# Patient Record
Sex: Female | Born: 1980 | Race: Black or African American | Hispanic: No | Marital: Single | Smoking: Never smoker
Health system: Southern US, Community
[De-identification: ages and names within clinical notes are randomized; demographics above are authoritative.]

## PROBLEM LIST (undated history)

## (undated) DIAGNOSIS — I10 Essential (primary) hypertension: Secondary | ICD-10-CM

## (undated) HISTORY — PX: NO PAST SURGERIES: SHX2092

---

## 2002-01-12 ENCOUNTER — Emergency Department (HOSPITAL_COMMUNITY): Admission: EM | Admit: 2002-01-12 | Discharge: 2002-01-12 | Payer: Self-pay

## 2002-11-16 ENCOUNTER — Emergency Department (HOSPITAL_COMMUNITY): Admission: EM | Admit: 2002-11-16 | Discharge: 2002-11-16 | Payer: Self-pay | Admitting: Emergency Medicine

## 2003-03-01 ENCOUNTER — Other Ambulatory Visit: Admission: RE | Admit: 2003-03-01 | Discharge: 2003-03-01 | Payer: Self-pay | Admitting: Obstetrics and Gynecology

## 2003-06-26 ENCOUNTER — Inpatient Hospital Stay (HOSPITAL_COMMUNITY): Admission: AD | Admit: 2003-06-26 | Discharge: 2003-06-26 | Payer: Self-pay | Admitting: Family Medicine

## 2003-08-02 ENCOUNTER — Inpatient Hospital Stay (HOSPITAL_COMMUNITY): Admission: AD | Admit: 2003-08-02 | Discharge: 2003-08-05 | Payer: Self-pay | Admitting: Obstetrics and Gynecology

## 2003-08-14 ENCOUNTER — Inpatient Hospital Stay (HOSPITAL_COMMUNITY): Admission: EM | Admit: 2003-08-14 | Discharge: 2003-08-16 | Payer: Self-pay | Admitting: Emergency Medicine

## 2003-08-15 ENCOUNTER — Encounter (INDEPENDENT_AMBULATORY_CARE_PROVIDER_SITE_OTHER): Payer: Self-pay | Admitting: Cardiovascular Disease

## 2006-08-25 ENCOUNTER — Emergency Department (HOSPITAL_COMMUNITY): Admission: EM | Admit: 2006-08-25 | Discharge: 2006-08-25 | Payer: Self-pay | Admitting: Emergency Medicine

## 2006-09-09 ENCOUNTER — Ambulatory Visit (HOSPITAL_COMMUNITY): Admission: RE | Admit: 2006-09-09 | Discharge: 2006-09-09 | Payer: Self-pay | Admitting: Obstetrics and Gynecology

## 2007-04-06 ENCOUNTER — Inpatient Hospital Stay (HOSPITAL_COMMUNITY): Admission: AD | Admit: 2007-04-06 | Discharge: 2007-04-08 | Payer: Self-pay | Admitting: Obstetrics and Gynecology

## 2008-08-01 ENCOUNTER — Emergency Department (HOSPITAL_COMMUNITY): Admission: EM | Admit: 2008-08-01 | Discharge: 2008-08-01 | Payer: Self-pay | Admitting: Emergency Medicine

## 2008-09-20 ENCOUNTER — Emergency Department (HOSPITAL_COMMUNITY): Admission: EM | Admit: 2008-09-20 | Discharge: 2008-09-20 | Payer: Self-pay | Admitting: Emergency Medicine

## 2009-09-18 ENCOUNTER — Emergency Department (HOSPITAL_COMMUNITY): Admission: EM | Admit: 2009-09-18 | Discharge: 2009-09-18 | Payer: Self-pay | Admitting: Family Medicine

## 2009-10-07 ENCOUNTER — Emergency Department (HOSPITAL_COMMUNITY): Admission: EM | Admit: 2009-10-07 | Discharge: 2009-10-07 | Payer: Self-pay | Admitting: Emergency Medicine

## 2010-08-09 ENCOUNTER — Emergency Department (HOSPITAL_COMMUNITY): Admission: EM | Admit: 2010-08-09 | Discharge: 2010-08-09 | Payer: Self-pay | Admitting: Emergency Medicine

## 2010-09-17 ENCOUNTER — Emergency Department (HOSPITAL_COMMUNITY)
Admission: EM | Admit: 2010-09-17 | Discharge: 2010-09-17 | Payer: Self-pay | Source: Home / Self Care | Admitting: Emergency Medicine

## 2010-09-17 LAB — ABO/RH

## 2010-10-13 ENCOUNTER — Encounter: Payer: Self-pay | Admitting: Obstetrics and Gynecology

## 2010-12-02 LAB — DIFFERENTIAL
Basophils Absolute: 0 10*3/uL (ref 0.0–0.1)
Basophils Relative: 0 % (ref 0–1)
Eosinophils Absolute: 0.1 10*3/uL (ref 0.0–0.7)
Eosinophils Relative: 1 % (ref 0–5)
Lymphocytes Relative: 44 % (ref 12–46)
Lymphs Abs: 2 10*3/uL (ref 0.7–4.0)
Monocytes Absolute: 0.3 10*3/uL (ref 0.1–1.0)
Monocytes Relative: 6 % (ref 3–12)
Neutro Abs: 2.2 10*3/uL (ref 1.7–7.7)
Neutrophils Relative %: 48 % (ref 43–77)

## 2010-12-02 LAB — URINALYSIS, ROUTINE W REFLEX MICROSCOPIC
Bilirubin Urine: NEGATIVE
Glucose, UA: NEGATIVE mg/dL
Ketones, ur: NEGATIVE mg/dL
Leukocytes, UA: NEGATIVE
Nitrite: NEGATIVE
Protein, ur: NEGATIVE mg/dL
Specific Gravity, Urine: 1.024 (ref 1.005–1.030)
Urobilinogen, UA: 1 mg/dL (ref 0.0–1.0)
pH: 6.5 (ref 5.0–8.0)

## 2010-12-02 LAB — BASIC METABOLIC PANEL
BUN: 8 mg/dL (ref 6–23)
CO2: 25 mEq/L (ref 19–32)
Calcium: 8.9 mg/dL (ref 8.4–10.5)
Chloride: 105 mEq/L (ref 96–112)
Creatinine, Ser: 0.64 mg/dL (ref 0.4–1.2)
GFR calc Af Amer: >60 mL/min (ref >60)
GFR calc non Af Amer: >60 mL/min (ref >60)
Glucose, Bld: 110 mg/dL — ABNORMAL HIGH (ref 70–99)
Potassium: 3.4 mEq/L — ABNORMAL LOW (ref 3.5–5.1)
Sodium: 136 mEq/L (ref 135–145)

## 2010-12-02 LAB — CBC
HCT: 38.2 % (ref 36.0–46.0)
Hemoglobin: 12.8 g/dL (ref 12.0–15.0)
MCH: 26.9 pg (ref 26.0–34.0)
MCHC: 33.5 g/dL (ref 30.0–36.0)
MCV: 80.4 fL (ref 78.0–100.0)
Platelets: 243 10*3/uL (ref 150–400)
RBC: 4.75 MIL/uL (ref 3.87–5.11)
RDW: 12.8 % (ref 11.5–15.5)
WBC: 4.5 10*3/uL (ref 4.0–10.5)

## 2010-12-02 LAB — RAPID URINE DRUG SCREEN, HOSP PERFORMED
Amphetamines: NOT DETECTED
Barbiturates: NOT DETECTED
Benzodiazepines: NOT DETECTED
Cocaine: NOT DETECTED
Opiates: NOT DETECTED
Tetrahydrocannabinol: NOT DETECTED

## 2010-12-02 LAB — URINE MICROSCOPIC-ADD ON

## 2010-12-02 LAB — ACETAMINOPHEN LEVEL: Acetaminophen (Tylenol), Serum: <10 ug/mL — ABNORMAL LOW (ref 10–30)

## 2010-12-02 LAB — PREGNANCY, URINE: Preg Test, Ur: NEGATIVE

## 2010-12-02 LAB — SALICYLATE LEVEL: Salicylate Lvl: <4 mg/dL (ref 2.8–20.0)

## 2010-12-02 LAB — ETHANOL: Alcohol, Ethyl (B): <5 mg/dL (ref 0–10)

## 2010-12-23 LAB — POCT PREGNANCY, URINE: Preg Test, Ur: NEGATIVE

## 2010-12-23 LAB — POCT URINALYSIS DIP (DEVICE)
Bilirubin Urine: NEGATIVE
Glucose, UA: NEGATIVE mg/dL
Ketones, ur: NEGATIVE mg/dL
Nitrite: NEGATIVE
Protein, ur: 30 mg/dL — AB
Specific Gravity, Urine: 1.025 (ref 1.005–1.030)
Urobilinogen, UA: 1 mg/dL (ref 0.0–1.0)
pH: 6.5 (ref 5.0–8.0)

## 2011-01-30 ENCOUNTER — Inpatient Hospital Stay (HOSPITAL_COMMUNITY): Payer: Medicaid Other

## 2011-01-30 ENCOUNTER — Inpatient Hospital Stay (HOSPITAL_COMMUNITY)
Admission: AD | Admit: 2011-01-30 | Discharge: 2011-01-30 | Disposition: A | Discharge status: Home / Self Care | Payer: Medicaid Other | Source: Ambulatory Visit | Attending: Obstetrics and Gynecology | Admitting: Obstetrics and Gynecology

## 2011-01-30 DIAGNOSIS — O99891 Other specified diseases and conditions complicating pregnancy: Secondary | ICD-10-CM | POA: Insufficient documentation

## 2011-01-30 LAB — CBC
HCT: 35.4 % — ABNORMAL LOW (ref 36.0–46.0)
Hemoglobin: 11.9 g/dL — ABNORMAL LOW (ref 12.0–15.0)
MCH: 27 pg (ref 26.0–34.0)
MCHC: 33.6 g/dL (ref 30.0–36.0)
MCV: 80.5 fL (ref 78.0–100.0)
Platelets: 215 10*3/uL (ref 150–400)
RBC: 4.4 MIL/uL (ref 3.87–5.11)
RDW: 13.5 % (ref 11.5–15.5)
WBC: 8.5 10*3/uL (ref 4.0–10.5)

## 2011-01-30 LAB — HIV ANTIBODY (ROUTINE TESTING W REFLEX): HIV: NONREACTIVE

## 2011-01-30 LAB — ABO/RH: RH Type: POSITIVE

## 2011-01-30 LAB — HEPATITIS B SURFACE ANTIGEN: Hepatitis B Surface Ag: NEGATIVE

## 2011-01-30 LAB — ANTIBODY SCREEN: Antibody Screen: NEGATIVE

## 2011-02-04 NOTE — H&P (Signed)
Leslie Decker, Leslie Decker              ACCOUNT NO.:  [id number]   MEDICAL RECORD NO.:  [id number]          PATIENT TYPE:  INP   LOCATION:  9169                          FACILITY:  [facility]   PHYSICIAN:  Crist Fat. Rivard, M.D. DATE OF BIRTH:  [birth year]   DATE OF ADMISSION:  04/06/2007  DATE OF DISCHARGE:                              HISTORY & PHYSICAL   HISTORY:  Leslie Decker is a 30 year old single black female, gravida 5,  para 1, 0, 3, 1, at 39-5/7th weeks, who presents with leaking clear  fluid since 1:30 a.m.  She denies contractions.  Denies bleeding.  Reports positive fetal movement.  No signs or symptoms of PIH.  Her  pregnancy has been followed by the [facility] and has been remarkable for obesity, asthma,  migraines, questionable last menstrual period, EAB x3, first trimester  spotting, planned bilateral tubal ligation, Group-B Streptococcus  negative.   Her prenatal labs were collected on October 27, 2006.  Hemoglobin 12.5,  hematocrit 38.3, platelets 240,000.  Blood type B-positive.  Antibody  negative, sickle cell trait negative, RPR nonreactive, rubella immune,  hepatitis-B surface antigen negative, HIV non-reactive.  Pap smear was  within normal limits.  Gonorrhea negative, Chlamydia negative.  Cystic  fibrosis negative.  Clot screen within normal limits.  One-hour Glucola  from November 12, 2006, was 117.  One-hour Glucola again from January 08, 2007, was 105.  HIV at that time was non-reactive.  RPR at that time was  non-reactive.  Culture of the vaginal track for Group-B Streptococcus on  March 08, 2007, was negative.   HISTORY OF PRESENT PREGNANCY:  [facility] 16-5/7th weeks gestation.  She was  still having some spotting that she had in the first trimester.  In the  first trimester she had an ultrasound for viability and dating which  gave her the best [facility] of April 08, 2007.  Anatomy ultrasound at [redacted] weeks  gestation showed growth consistent with previous dating, confirming the  [facility].  Normal cardiac anatomy was seen at [redacted] weeks gestation.  The  patient signed her postpartum tubal papers on December 10, 2006.  An  ultrasonography at 31 weeks was done due to size greater than dates,  giving an estimated fetal weight in the 70's percentile with normal  fluid.  Ultrasonography again at [redacted] weeks gestation showed estimated  fetal weight to be 6 pounds and 2 ounces, which was at the 45th  percentile, with normal fluid and vertex presentation.  The rest of her  prenatal care has been unremarkable.   OB HISTORY:  She is gravida 5, para 1, 0, 3, 1.  In 2002, she had a  first trimester EAB and in November 2004, she had a vaginal delivery of  a female infant weighing 7 pounds and 6 ounces at [redacted] weeks gestation,  after 20 hours of labor.  She had an epidural from anesthesia.  The  infant's name is Jordan.  In 2005, and in 2007, in the second trimester  at approximately 13 weeks she had elective Abs.   ALLERGIES:  No known drug allergies.   MENSTRUAL HISTORY:  She experienced menarche at age 75 with 28-day  cycles lasting for five days.  She had an abnormal Pap smear in 2001.   PAST MEDICAL HISTORY:  1. She was diagnosed with asthma in 2002.  2. She had a urinary tract infection x1 in the past.  3. She reports having back problems from her epidural.  4. Migraines after her delivery.   PAST SURGICAL HISTORY:  1. Remarkable for adenoidectomy as a child.  2. Tubes in her ears.   FAMILY HISTORY:  Paternal grandmother with a myocardial infarction.  Paternal grandmother with chronic hypertension.  Maternal grandmother  with insulin-dependent diabetes.  Maternal aunt with thyroid  dysfunction.  Maternal grandfather with a CVA.  Maternal aunt and  paternal uncle with unknown types of cancer.   GENETIC HISTORY:  Remarkable for the father of the baby with cleft  lip  and with sickle cell trait.  The father of the baby's daughter with  sickle cell trait.  The father of the baby's siblings with sickle cell  disease.   SOCIAL HISTORY:  The patient is single.  Father of the baby is involved  and supportive.  His name is Jamar.  The patient has 16 years of  education and is unemployed.  The father of the baby has 12 years of  education and is employed full time as a Merchandiser, retail.  They deny any  alcohol, tobacco or illicit drug use with the pregnancy.   PHYSICAL EXAMINATION:  VITAL SIGNS:  Stable.  She is afebrile.  HEENT:  Grossly within normal limits.  CHEST:  Clear to auscultation.  HEART:  Regular rate and rhythm.  ABDOMEN:  Gravid and contour with fundal height extending approximately  40 cm above the pubic symphysis.  Fetal heart rate is reactive and  reassuring.  There are no contractions.  PELVIC EXAM:  Shows positive fluid on the perineum.  Positive Nitrazine,  positive ferning.  Cervix is 3 cm, 70% effaced, with vertex -2, with  clear fluid present.  EXTREMITIES:  Normal.   ASSESSMENT:  1. Intrauterine pregnancy at term.  2. Premature rupture of membranes.  3. Group-B Streptococcus negative.   PLAN:  1. To admit to the birthing suite.  2. Routine C.N.M. orders.  3. Planned expectant management for now.      Cam Hai, C.N.M.      Crist Fat Rivard, M.D.  Electronically Signed    KS/MEDQ  D:  04/06/2007  T:  04/06/2007  Job:  [id number]

## 2011-02-07 NOTE — H&P (Signed)
NAMEZURIA, FOSDICK NO.:  [id number]   MEDICAL RECORD NO.:  [id number]                   PATIENT TYPE:  INP   LOCATION:  9164                                 FACILITY:  [facility]   PHYSICIAN:  Osborn Coho, M.D.                DATE OF BIRTH:  09/26/1980   DATE OF ADMISSION:  08/02/2003  DATE OF DISCHARGE:                                HISTORY & PHYSICAL   This is a 30 year old gravida 2, para 0-0-1-0, at 41-1/7 weeks, who presents  with complaints of gush of fluid around midnight with mild uterine  contractions following.  Reports positive fetal movement and no bleeding.  Pregnancy has been followed by the nurse midwife service and remarkable for:   1. Unsure dates.  2. Late to care.  3. Asthma.  4. Group B strep positive.    PAST OBSTETRICAL HISTORY:  Remarkable for first trimester elective abortion  in 2000.   PAST MEDICAL HISTORY:  Remarkable for history of abnormal Pap once with  normal ones after that.  History of childhood varicella.  History of asthma,  for which she uses an inhaler on as-needed basis.   PAST SURGICAL HISTORY:  Remarkable for adenoidectomy and tubes in her ears  as a child.   FAMILY HISTORY:  Remarkable for grandmother with heart disease, a  grandmother, grandfather, and aunt with hypertension, grandmother with  diabetes, an aunt with thyroid disease, father with drug and alcohol use.   GENETIC HISTORY:  Unremarkable.   SOCIAL HISTORY:  Patient is single but involved with Jamar Sparkman, who is  involved and supportive.  She is of the [employer].  She denies any  alcohol, tobacco, or drug use.   PRENATAL LABORATORY DATA:  Hemoglobin 11.3, platelets 248.  Blood type B  positive, antibody screen negative.  Sickle cell negative.  RPR nonreactive.  Rubella immune.  Hepatitis negative.  HIV nonreactive.  Pap test normal.  Gonorrhea negative.  Chlamydia negative.  Cystic fibrosis declined.  Quad  screen not done  secondary to late to care.  Group B strep positive.   OBJECTIVE:  VITAL SIGNS:  Stable, afebrile.  HEENT:  Within normal limits.  NECK:  Thyroid normal, not enlarged.  CHEST:  Clear to auscultation.  CARDIAC:  Regular rate and rhythm.  ABDOMEN:  Gravid at 40 cm, vertex to Leopold's.  EFM shows a nonreactive  fetal heart rate tracing with no decelerations.  Uterine contractions every  two to three minutes, which are mild.  PELVIC:  Cervical exam reveals gross ruptured membranes with clear fluid,  positive Nitrazine, and a cervix dilation of 2 cm, 80% effaced, -1 station,  with a vertex presentation.  EXTREMITIES:  Within normal limits.   ASSESSMENT:  1. Intrauterine pregnancy at 41-1/7 weeks.  2. Premature rupture of membrane at term.  3. Latent phase labor.  4. Group B Streptococcus positive.   PLAN:  1.  Admit to birthing suites, Dr. Su Hilt notified.  2. Routine C.N.M. orders.  3. Penicillin prophylaxis.  4. Expectant management versus Pitocin were discussed and since the patient     is contracting, will await increased labor for now and use Pitocin     augmentation later as needed.     Marie L. Williams, C.N.M.                 Osborn Coho, M.D.    MLW/MEDQ  D:  08/02/2003  T:  08/02/2003  Job:  [id number]

## 2011-02-07 NOTE — H&P (Signed)
NAMEGEORGA, STYS NO.:  [id number]   MEDICAL RECORD NO.:  [id number]                   PATIENT TYPE:  INP   LOCATION:  3029                                 FACILITY:  [facility]   PHYSICIAN:  Marlan Palau, M.D.               DATE OF BIRTH:  11/26/1980   DATE OF ADMISSION:  08/14/2003  DATE OF DISCHARGE:                                HISTORY & PHYSICAL   HISTORY OF PRESENT ILLNESS:  Leslie Decker is a 30 year old right-handed  black female born [birth year], with a history of a recent delivery  approximately a week ago. The patient is not married living with her parents  in the [place] area. The patient apparently came home from school today  upset as she had gotten into an argument with her professor concerning a  paper that was due.  The patient developed a headache around 6:30 p.m. this  evening in the bifrontal region. The patient claims the headaches are not  frequent for her having one or two a year. The patient, however, by 9:30  p.m. developed increased headache and onset of left-sided weakness and  numbness. The patient lost vision off the left and had a sharp pain that  shot down the left side of her body, face, arms, and legs, and with this  loss the use of the left side of the body.  The patient was unable to speak  well, was reportedly flaccid on the left side when she first came to the  emergency department. The patient underwent a CT scan of the brain that was  unremarkable and by the time neurology arrived for an evaluation, the  patient had regained all strength in the left side, but still complained of  some decreased sensation in the left arm and leg excluding the face. The  patient had normalized vision and speech. No evidence of hemorrhage or  infarct noted on CT. The patient is admitted at this point for further  evaluation.   PAST MEDICAL HISTORY:  1. New onset left-sided deficits as above that have significantly  resolved.  2. Headache as above.  3. Obesity.  4. Postpartum times one week; no complications during pregnancy were noted     such as elevation of blood sugar, blood pressure, or proteinuria.  5. History of asthma.   MEDICATIONS:  At this time include albuterol inhaler p.r.n.   ALLERGIES:  No known drug allergies.   SOCIAL HISTORY:  She does not smoke or drink. The patient denies the use of  drug such as cocaine or marijuana. The patient is single and lives in the  [place] area. She is a Consulting civil engineer, not employed.   FAMILY MEDICAL HISTORY:  Notable for hypertension. On the father's side  there is an abdominal aortic aneurysm. Diabetes runs in the mother's side.   REVIEW OF SYSTEMS:  No recent fevers or chills. The patient did report  headache today. Denies nausea, vomiting, shortness of breath, chest pain,  palpitations, trouble controlling bowels or bladder. The patient delivered  by spontaneous vaginal delivery a week ago without complications. The  patient denies blackouts or seizures.   PHYSICAL EXAMINATION:  VITAL SIGNS: Blood pressure 123/67, heart rate 80,  respiratory rate 26, temperature 99.3.  GENERAL: This patient is moderately obese black female who is alert and  cooperative at the time of examination.  HEENT:  Head is atraumatic. Eyes reveal pupils equal, round, and reactive to  light. Disks are flat bilaterally.  NECK: Supple. No carotid bruits.  RESPIRATORY:  Clear.  CARDIOVASCULAR: Regular rate and rhythm with no obvious murmurs or rubs  noted.  ABDOMEN: Soft and nontender; positive bowel sounds.  EXTREMITIES: Without significant edema.  NEUROLOGIC: Cranial nerves as above. Facial asymmetry is present. The  patient has good smile and grimace. Has good symmetry to pinprick sensation  in the lower face, maybe a decreased pinprick sensation slightly in the left  forehead as compared to the right. The patient has normal speech pattern,  full visual fields, full  extraocular movements seen. Motor strength is full  and normal in all four extremities. No drift is seen on the arm or leg. The  patient has decreased pinprick sensation and vibratory sensation in the left  arm and left leg as compared to the right. Deep tendon reflexes are brisk,  but symmetric throughout. Toes are neutral bilaterally.  The patient has  fair finger-nose-finger.  No toe-to-finger bilaterally.  The patient is not  ambulated.   Laboratory values including comprehensive metabolic profiles, CBC, and coags  are pending at this time.  Chest x-ray is pending.  CT of the head is as  above.   IMPRESSION:  1. Transient episode of left-sided weakness, headache.  Rule out transient     ischemic attack/neurologic migraine/psychogenic event.  2. One-week postpartum.  3. History of asthma.   This patient has no objective neurologic deficit at this point. She does  complain of some numbness on the left side. ER physician staff states that  patient was flaccid on the left side when she first came to the emergency  department, but clearly is not the case at this point and has normal  strength. Will admit this patient for further evaluation at this point.   PLAN:  1. Admission to [facility].  2. IV fluids.  3. IV heparin.  4. MRI of the brain.  5. MRA.  6. 2-D echocardiogram.  7. Consider transesophageal echocardiogram.  8. Hypocoagulable state workup.  9. Will get a urine drug screen as well.   I will follow the patient's clinical course while inhouse.                                                Marlan Palau, M.D.    CKW/MEDQ  D:  08/14/2003  T:  08/15/2003  Job:  [id number]   cc:   [facility]  [address] [place]

## 2011-02-07 NOTE — Discharge Summary (Signed)
Leslie Decker, POMERLEAU                          ACCOUNT NO.:  [id number]   MEDICAL RECORD NO.:  [id number]                   PATIENT TYPE:  INP   LOCATION:  3029                                 FACILITY:  [facility]   PHYSICIAN:  Pramod P. Pearlean Brownie, MD                 DATE OF BIRTH:  04-19-1981   DATE OF ADMISSION:  08/14/2003  DATE OF DISCHARGE:  08/16/2003                                 DISCHARGE SUMMARY   DISCHARGE DIAGNOSES:  1. 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  2. Two weeks postpartum.  3. Obesity.  4. History of asthma.   DISCHARGE MEDICATIONS:  1. Birth control pills.  2. Pulmicort.   STUDIES PERFORMED:  1. CT of the head on admission shows mucous retention cyst in right     maxillary sinus.  No definite signs of stroke.  Some faint hypodensity in     the left cerebellum and left pons which is highly likely due to streak     infarct.  2. MRI of the brain negative for acute infarct.  3. MRA of the brain negative.  4. Chest x-ray:  No active disease.  5. EKG:  Normal sinus rhythm.   LABORATORIES:  Hemoglobin 11.5, hematocrit 34.4, white blood cells 4.9, red  blood cells 4.16, MCV 82.7, MCHC 33.4, RDW 13.2, platelets 316,000.  Differential normal.  Sedimentation rate 17.  Coagulation studies normal.  Antithrombin 379.  Antiphospholipid antibody with lupus anticoagulant  detected.  Total protein C & S pending.  Protein S functional 69, low.  Protein C functional 94.  Sodium 140, potassium 3.5, chloride 108, CO2 25,  glucose 90, BUN 8, creatinine 0.8, calcium 8.5.  Total protein 6.1, albumin  3.3, AST 16, ALT 21, ALP 76, total bilirubin 0.6.  NGO converting enzymes  pending.  Cholesterol 128, triglycerides 105, HDL 40, LDL 67.  Serum drug  screen pending.  RPR negative.  ANA pending.  Antiphospholipid antibody  pending.   HISTORY OF PRESENT ILLNESS:  Ms. Leslie Decker is a 30 year old right-  handed black female postpartum two weeks who came home from school today  upset that she got in an argument with her professor concerning a paper that  was due.  She developed a headache around 6:30 p.m. in the bifrontal region.  She claims the headaches are not frequent for her, having only one to two  this year.  By 9:30 p.m. the headache increased in intensity and she  developed left-sided weakness and numbness.  She lost vision off to the left  and had a sharp pain that shot down the left side of her body, face, arms,  and legs and loss the use of the left side of her body.  She was unable to  speak well and was reportedly flaccid on the left when she first came to the  emergency department.  She underwent a CT scan of the brain that  was  unremarkable and by the time neurology arrived for evaluation the patient  had regained all strength on the left side, but still complained of some  decreased sensation in the left arm and leg including the face.  Her vision  and speech normalized.  She had no evidence of hemorrhage or infarct on the  CT.  She was admitted to the hospital for further work-up.   MRI was negative for acute infarct.  It was determined that she had history  of panic attack some years ago and was on Paxil for that.  The night before  discharge she did have an episode of slurred speech with inability to move  the tongue to the right and could not feel her left arm.  This occurred  about 10:06 p.m.  By 10:50 p.m. symptoms had resolved except for the  headache.  It was again felt that this was most likely related to anxiety as  MRI was negative (of note, she is in school as a speech therapist).   Of note, secondary to stroke risk, patient was evaluated for hypercoagulable  work-up.  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  This needs to be followed up by  her primary physician as other work-up remains pending.  The patient has  been encouraged to see a primary care physician within the next month.  She  plans to go home with her parents for a few weeks  after discharge from the  hospital.   CONDITION ON DISCHARGE:  The patient alert and oriented x3.  She is sleepy,  but appropriate.  Her speech is clear.  She has no aphasia.  She has no  weakness and her sensation is intact.   DISCHARGE PLAN:  Discharge home with family.   FOLLOWUP:  Primary M.D. for anxiety treatment and lupus anticoagulant follow-  up.      Annie Main, N.P.                         Pramod P. Pearlean Brownie, MD    SB/MEDQ  D:  08/16/2003  T:  08/16/2003  Job:  [id number]   cc:   [facility]

## 2011-02-14 ENCOUNTER — Inpatient Hospital Stay (HOSPITAL_COMMUNITY)
Admission: AD | Admit: 2011-02-14 | Discharge: 2011-02-14 | Disposition: A | Discharge status: Home / Self Care | Payer: Medicaid Other | Source: Ambulatory Visit | Attending: Obstetrics and Gynecology | Admitting: Obstetrics and Gynecology

## 2011-02-14 DIAGNOSIS — O210 Mild hyperemesis gravidarum: Secondary | ICD-10-CM | POA: Insufficient documentation

## 2011-02-14 LAB — URINALYSIS, ROUTINE W REFLEX MICROSCOPIC
Glucose, UA: NEGATIVE mg/dL
Hgb urine dipstick: NEGATIVE
Ketones, ur: NEGATIVE mg/dL
Protein, ur: NEGATIVE mg/dL
Urobilinogen, UA: 0.2 mg/dL (ref 0.0–1.0)

## 2011-02-14 LAB — DIFFERENTIAL
Basophils Absolute: 0 10*3/uL (ref 0.0–0.1)
Eosinophils Relative: 1 % (ref 0–5)
Lymphocytes Relative: 14 % (ref 12–46)
Lymphs Abs: 0.9 10*3/uL (ref 0.7–4.0)
Neutro Abs: 5.3 10*3/uL (ref 1.7–7.7)
Neutrophils Relative %: 79 % — ABNORMAL HIGH (ref 43–77)

## 2011-02-14 LAB — COMPREHENSIVE METABOLIC PANEL
ALT: 17 U/L (ref 0–35)
Alkaline Phosphatase: 42 U/L (ref 39–117)
BUN: 4 mg/dL — ABNORMAL LOW (ref 6–23)
CO2: 19 mEq/L (ref 19–32)
Chloride: 103 mEq/L (ref 96–112)
Glucose, Bld: 109 mg/dL — ABNORMAL HIGH (ref 70–99)
Potassium: 3.8 mEq/L (ref 3.5–5.1)
Sodium: 133 mEq/L — ABNORMAL LOW (ref 135–145)
Total Bilirubin: 0.3 mg/dL (ref 0.3–1.2)
Total Protein: 6.6 g/dL (ref 6.0–8.3)

## 2011-02-14 LAB — CBC
HCT: 36.3 % (ref 36.0–46.0)
Hemoglobin: 12 g/dL (ref 12.0–15.0)
MCV: 81.4 fL (ref 78.0–100.0)
RBC: 4.46 MIL/uL (ref 3.87–5.11)
WBC: 6.7 10*3/uL (ref 4.0–10.5)

## 2011-06-23 ENCOUNTER — Inpatient Hospital Stay (HOSPITAL_COMMUNITY)
Admission: AD | Admit: 2011-06-23 | Discharge: 2011-06-27 | DRG: 767 | Disposition: A | Discharge status: Home / Self Care | Payer: Medicaid Other | Source: Ambulatory Visit | Attending: Obstetrics and Gynecology | Admitting: Obstetrics and Gynecology

## 2011-06-23 ENCOUNTER — Encounter (HOSPITAL_COMMUNITY): Payer: Self-pay | Admitting: *Deleted

## 2011-06-23 DIAGNOSIS — O1493 Unspecified pre-eclampsia, third trimester: Secondary | ICD-10-CM | POA: Diagnosis present

## 2011-06-23 DIAGNOSIS — IMO0002 Reserved for concepts with insufficient information to code with codable children: Principal | ICD-10-CM | POA: Diagnosis present

## 2011-06-23 DIAGNOSIS — O99210 Obesity complicating pregnancy, unspecified trimester: Secondary | ICD-10-CM | POA: Diagnosis present

## 2011-06-23 DIAGNOSIS — Z302 Encounter for sterilization: Secondary | ICD-10-CM

## 2011-06-23 DIAGNOSIS — O99214 Obesity complicating childbirth: Secondary | ICD-10-CM | POA: Diagnosis present

## 2011-06-23 DIAGNOSIS — E669 Obesity, unspecified: Secondary | ICD-10-CM | POA: Diagnosis present

## 2011-06-23 DIAGNOSIS — O1490 Unspecified pre-eclampsia, unspecified trimester: Secondary | ICD-10-CM

## 2011-06-23 DIAGNOSIS — K219 Gastro-esophageal reflux disease without esophagitis: Secondary | ICD-10-CM | POA: Diagnosis present

## 2011-06-23 DIAGNOSIS — J45909 Unspecified asthma, uncomplicated: Secondary | ICD-10-CM | POA: Diagnosis present

## 2011-06-23 DIAGNOSIS — I10 Essential (primary) hypertension: Secondary | ICD-10-CM | POA: Diagnosis present

## 2011-06-23 LAB — COMPREHENSIVE METABOLIC PANEL
Albumin: 2.9 g/dL — ABNORMAL LOW (ref 3.5–5.2)
Alkaline Phosphatase: 112 U/L (ref 39–117)
BUN: 11 mg/dL (ref 6–23)
CO2: 22 mEq/L (ref 19–32)
Chloride: 104 mEq/L (ref 96–112)
GFR calc non Af Amer: >90 mL/min (ref >90)
Potassium: 4.4 mEq/L (ref 3.5–5.1)
Total Bilirubin: 0.2 mg/dL — ABNORMAL LOW (ref 0.3–1.2)

## 2011-06-23 LAB — URINALYSIS, DIPSTICK ONLY
Bilirubin Urine: NEGATIVE
Nitrite: NEGATIVE
Specific Gravity, Urine: >1.03 — ABNORMAL HIGH (ref 1.005–1.030)
pH: 6 (ref 5.0–8.0)

## 2011-06-23 LAB — CBC
HCT: 34.8 % — ABNORMAL LOW (ref 36.0–46.0)
Hemoglobin: 11.7 g/dL — ABNORMAL LOW (ref 12.0–15.0)
MCV: 80.4 fL (ref 78.0–100.0)
Platelets: 236 10*3/uL (ref 150–400)
RBC: 4.33 MIL/uL (ref 3.87–5.11)
WBC: 8 10*3/uL (ref 4.0–10.5)

## 2011-06-23 LAB — URIC ACID: Uric Acid, Serum: 4.7 mg/dL (ref 2.4–7.0)

## 2011-06-23 MED ORDER — OXYTOCIN BOLUS FROM INFUSION
500.0000 mL | Freq: Once | INTRAVENOUS | Status: AC
  Administered 2011-06-25: 500 mL via INTRAVENOUS
  Filled 2011-06-23: qty 1000
  Filled 2011-06-23: qty 500

## 2011-06-23 MED ORDER — IBUPROFEN 600 MG PO TABS: 600.0000 mg | ORAL_TABLET | Freq: Four times a day (QID) | ORAL | Status: DC | PRN

## 2011-06-23 MED ORDER — ONDANSETRON HCL 4 MG/2ML IJ SOLN: 4.0000 mg | Freq: Four times a day (QID) | INTRAMUSCULAR | Status: DC | PRN

## 2011-06-23 MED ORDER — OXYCODONE-ACETAMINOPHEN 5-325 MG PO TABS: 2.0000 | ORAL_TABLET | ORAL | Status: DC | PRN

## 2011-06-23 MED ORDER — LACTATED RINGERS IV SOLN
500.0000 mL | INTRAVENOUS | Status: DC | PRN
  Administered 2011-06-25: 500 mL via INTRAVENOUS

## 2011-06-23 MED ORDER — LIDOCAINE HCL (PF) 1 % IJ SOLN
30.0000 mL | INTRAMUSCULAR | Status: DC | PRN
  Filled 2011-06-23: qty 30

## 2011-06-23 MED ORDER — LABETALOL HCL 5 MG/ML IV SOLN
10.0000 mg | Freq: Once | INTRAVENOUS | Status: AC
  Administered 2011-06-23: 10 mg via INTRAVENOUS
  Filled 2011-06-23: qty 4

## 2011-06-23 MED ORDER — MAGNESIUM SULFATE BOLUS VIA INFUSION
4.0000 g | Freq: Once | INTRAVENOUS | Status: DC
  Filled 2011-06-23: qty 500

## 2011-06-23 MED ORDER — MAGNESIUM SULFATE 40 G IN LACTATED RINGERS - SIMPLE
2.0000 g/h | INTRAVENOUS | Status: DC
  Administered 2011-06-24: 4 g/h via INTRAVENOUS
  Administered 2011-06-24 – 2011-06-26 (×3): 2 g/h via INTRAVENOUS
  Filled 2011-06-23 (×4): qty 500

## 2011-06-23 MED ORDER — ACETAMINOPHEN 325 MG PO TABS: 650.0000 mg | ORAL_TABLET | ORAL | Status: DC | PRN

## 2011-06-23 MED ORDER — OXYTOCIN 20 UNITS IN LACTATED RINGERS INFUSION - SIMPLE
125.0000 mL/h | Freq: Once | INTRAVENOUS | Status: AC
  Administered 2011-06-25: 125 mL/h via INTRAVENOUS

## 2011-06-23 MED ORDER — CITRIC ACID-SODIUM CITRATE 334-500 MG/5ML PO SOLN: 30.0000 mL | ORAL | Status: DC | PRN

## 2011-06-23 MED ORDER — LACTATED RINGERS IV SOLN
INTRAVENOUS | Status: DC
  Administered 2011-06-24 – 2011-06-25 (×3): via INTRAVENOUS

## 2011-06-23 MED ORDER — FLEET ENEMA 7-19 GM/118ML RE ENEM: 1.0000 | ENEMA | RECTAL | Status: DC | PRN

## 2011-06-24 ENCOUNTER — Encounter (HOSPITAL_COMMUNITY): Payer: Self-pay | Admitting: *Deleted

## 2011-06-24 DIAGNOSIS — O1493 Unspecified pre-eclampsia, third trimester: Secondary | ICD-10-CM | POA: Diagnosis present

## 2011-06-24 DIAGNOSIS — O99210 Obesity complicating pregnancy, unspecified trimester: Secondary | ICD-10-CM | POA: Diagnosis present

## 2011-06-24 DIAGNOSIS — K219 Gastro-esophageal reflux disease without esophagitis: Secondary | ICD-10-CM | POA: Diagnosis present

## 2011-06-24 DIAGNOSIS — J45909 Unspecified asthma, uncomplicated: Secondary | ICD-10-CM | POA: Diagnosis present

## 2011-06-24 LAB — CBC
HCT: 34.7 % — ABNORMAL LOW (ref 36.0–46.0)
Hemoglobin: 11.6 g/dL — ABNORMAL LOW (ref 12.0–15.0)
MCH: 26.9 pg (ref 26.0–34.0)
MCHC: 33.4 g/dL (ref 30.0–36.0)
MCV: 80.3 fL (ref 78.0–100.0)
RDW: 13.9 % (ref 11.5–15.5)

## 2011-06-24 LAB — MAGNESIUM: Magnesium: 4.8 mg/dL — ABNORMAL HIGH (ref 1.5–2.5)

## 2011-06-24 LAB — URIC ACID: Uric Acid, Serum: 4.9 mg/dL (ref 2.4–7.0)

## 2011-06-24 LAB — COMPREHENSIVE METABOLIC PANEL
Albumin: 2.7 g/dL — ABNORMAL LOW (ref 3.5–5.2)
Alkaline Phosphatase: 106 U/L (ref 39–117)
BUN: 6 mg/dL (ref 6–23)
Creatinine, Ser: 0.6 mg/dL (ref 0.50–1.10)
GFR calc Af Amer: >90 mL/min (ref >90)
Glucose, Bld: 81 mg/dL (ref 70–99)
Potassium: 3.9 mEq/L (ref 3.5–5.1)
Total Protein: 6.3 g/dL (ref 6.0–8.3)

## 2011-06-24 LAB — LACTATE DEHYDROGENASE: LDH: 226 U/L (ref 94–250)

## 2011-06-24 LAB — POCT PREGNANCY, URINE: Preg Test, Ur: NEGATIVE

## 2011-06-24 MED ORDER — MENTHOL 3 MG MT LOZG
1.0000 | LOZENGE | OROMUCOSAL | Status: DC | PRN
  Filled 2011-06-24: qty 9

## 2011-06-24 MED ORDER — GUAIFENESIN-DM 100-10 MG/5ML PO SYRP
5.0000 mL | ORAL_SOLUTION | ORAL | Status: DC | PRN
  Filled 2011-06-24: qty 5

## 2011-06-24 MED ORDER — TERBUTALINE SULFATE 1 MG/ML IJ SOLN: 0.2500 mg | Freq: Once | INTRAMUSCULAR | Status: AC | PRN

## 2011-06-24 MED ORDER — DINOPROSTONE 10 MG VA INST
10.0000 mg | VAGINAL_INSERT | Freq: Once | VAGINAL | Status: AC
  Administered 2011-06-24: 10 mg via VAGINAL
  Filled 2011-06-24: qty 1

## 2011-06-24 MED ORDER — BUTORPHANOL TARTRATE 2 MG/ML IJ SOLN
2.0000 mg | INTRAMUSCULAR | Status: DC | PRN
  Administered 2011-06-25: 2 mg via INTRAVENOUS
  Filled 2011-06-24: qty 1

## 2011-06-24 MED ORDER — PRENATAL PLUS 27-1 MG PO TABS
1.0000 | ORAL_TABLET | Freq: Every day | ORAL | Status: DC
  Administered 2011-06-24: 1 via ORAL
  Filled 2011-06-24: qty 1

## 2011-06-24 MED ORDER — OXYTOCIN 20 UNITS IN LACTATED RINGERS INFUSION - SIMPLE
1.0000 m[IU]/min | INTRAVENOUS | Status: DC
  Administered 2011-06-24: 2 m[IU]/min via INTRAVENOUS
  Filled 2011-06-24: qty 1000

## 2011-06-24 MED ORDER — PANTOPRAZOLE SODIUM 20 MG PO TBEC
20.0000 mg | DELAYED_RELEASE_TABLET | Freq: Every day | ORAL | Status: DC
  Administered 2011-06-24: 20 mg via ORAL
  Filled 2011-06-24 (×2): qty 1

## 2011-06-24 NOTE — Progress Notes (Signed)
Leslie Decker is a 30 y.o. G3P2002 at [redacted]w[redacted]d admitted for induction of labor due to mild preeclampsia.  Subjective: Denies HA, visual changes or abdominal pain.  Just now started feeling contractions.  No complaints.  Objective: BP 146/77  Pulse 91  Temp(Src) 98.1 F (36.7 C) (Oral)  Resp 22  Ht 5\' 6"  (1.676 m)  Wt 133.358 kg (294 lb)  BMI 47.45 kg/m2 I/O last 3 completed shifts: In: 1127.9 [P.O.:350; I.V.:777.9] Out: 1525 [Urine:1525] Total I/O In: 1875.6 [P.O.:500; I.V.:1375.6] Out: 1580 [Urine:1580]  FHT:  FHR: 130's bpm, variability: moderate,  accelerations:  Present,  decelerations:  Absent UC:    irregular SVE:   Dilation: 2 Effacement (%): 50 Station: Ballotable Exam by:: [employer]: Lab Results  Component Value Date   WBC 8.0 06/23/2011   HGB 11.7* 06/23/2011   HCT 34.8* 06/23/2011   MCV 80.4 06/23/2011   PLT 236 06/23/2011    Assessment / Plan: 30yo G3P2 at 40 wks undergoing induction secondary to Gest HTN and presumption of mild preeclampsia on MGSO4.  Currently stable.  Cont to titrate pitocin per protocol.  Fetal status is overall reassuring.  Will check labs and MG level now.    Purcell Nails 06/24/2011, 6:56 PM

## 2011-06-24 NOTE — H&P (Signed)
Leslie Decker is a 30 y.o. female presenting for induction because of elevated blood pressure. Maternal Medical History:  Fetal activity: Perceived fetal activity is normal.    Prenatal complications: Hypertension and pre-eclampsia.   No bleeding, cholelithiasis, HIV, infection, IUGR, nephrolithiasis, oligohydramnios, placental abnormality, polyhydramnios, preterm labor, substance abuse, thrombocytopenia or thrombophilia.   Prenatal Complications - Diabetes: none.    OB History    Grav Para Term Preterm Abortions TAB SAB Ect Mult Living   3 2 2       2      Past Medical History  Diagnosis Date  . Asthma    Past Surgical History  Procedure Date  . No past surgeries    Family History: family history includes Diabetes in her maternal grandmother and Hypertension in her maternal grandmother. Social History:  reports that she has never smoked. She does not have any smokeless tobacco history on file. She reports that she does not drink alcohol or use illicit drugs.  Review of Systems  Eyes: Negative for blurred vision, double vision and photophobia.  Respiratory: Negative for wheezing.   Gastrointestinal: Negative for abdominal pain.  Neurological: Negative for headaches.  All other systems reviewed and are negative.    Dilation: 2.5 Effacement (%): 50 Station: Ballotable Exam by:: Dr. Pennie Rushing  Blood pressure 169/98, pulse 91, temperature 98.2 F (36.8 C), temperature source Oral, resp. rate 18, height 5\' 6"  (1.676 m), weight 294 lb (133.358 kg). Exam Physical Exam  Prenatal labs: ABO, Rh: AB/Positive/-- (05/10 0000) Antibody: Negative (05/10 0000) Rubella: Equivocal (05/10 0000) RPR: NON REACTIVE (10/01 2030)  HBsAg: Negative (05/10 0000)  HIV: Non-reactive (05/10 0000)  GBS:     Assessment/Plan: IUP at term Pre eclampsia Hx asthma Morbid obesity Equivocal Rubella  Plan: IV magnesium started Cervidil Start Pitocin in am.   Leslie Decker P 06/24/2011, 1:41  AM

## 2011-06-25 ENCOUNTER — Encounter (HOSPITAL_COMMUNITY)
Admission: AD | Disposition: A | Discharge status: Home / Self Care | Payer: Self-pay | Source: Ambulatory Visit | Attending: Obstetrics and Gynecology

## 2011-06-25 ENCOUNTER — Other Ambulatory Visit: Payer: Self-pay | Admitting: Obstetrics and Gynecology

## 2011-06-25 ENCOUNTER — Encounter (HOSPITAL_COMMUNITY): Payer: Self-pay | Admitting: Anesthesiology

## 2011-06-25 ENCOUNTER — Encounter (HOSPITAL_COMMUNITY): Payer: Self-pay

## 2011-06-25 ENCOUNTER — Inpatient Hospital Stay (HOSPITAL_COMMUNITY): Payer: Medicaid Other | Admitting: Anesthesiology

## 2011-06-25 HISTORY — PX: TUBAL LIGATION: SHX77

## 2011-06-25 LAB — COMPREHENSIVE METABOLIC PANEL
ALT: 19 U/L (ref 0–35)
AST: 18 U/L (ref 0–37)
Calcium: 8.1 mg/dL — ABNORMAL LOW (ref 8.4–10.5)
GFR calc Af Amer: >90 mL/min (ref >90)
Sodium: 136 mEq/L (ref 135–145)
Total Protein: 6.3 g/dL (ref 6.0–8.3)

## 2011-06-25 LAB — MRSA PCR SCREENING: MRSA by PCR: NEGATIVE

## 2011-06-25 LAB — CBC
MCH: 26.8 pg (ref 26.0–34.0)
MCHC: 33.1 g/dL (ref 30.0–36.0)
Platelets: 226 10*3/uL (ref 150–400)

## 2011-06-25 LAB — MAGNESIUM: Magnesium: 5.4 mg/dL — ABNORMAL HIGH (ref 1.5–2.5)

## 2011-06-25 SURGERY — LIGATION, FALLOPIAN TUBE, POSTPARTUM
Anesthesia: Epidural | Site: Abdomen | Laterality: Bilateral | Wound class: Clean Contaminated

## 2011-06-25 MED ORDER — SIMETHICONE 80 MG PO CHEW: 80.0000 mg | CHEWABLE_TABLET | ORAL | Status: DC | PRN

## 2011-06-25 MED ORDER — PRENATAL PLUS 27-1 MG PO TABS: 1.0000 | ORAL_TABLET | Freq: Every day | ORAL | Status: DC

## 2011-06-25 MED ORDER — ONDANSETRON HCL 4 MG/2ML IJ SOLN: 4.0000 mg | INTRAMUSCULAR | Status: DC | PRN

## 2011-06-25 MED ORDER — ONDANSETRON HCL 4 MG PO TABS: 4.0000 mg | ORAL_TABLET | ORAL | Status: DC | PRN

## 2011-06-25 MED ORDER — PHENYLEPHRINE 40 MCG/ML (10ML) SYRINGE FOR IV PUSH (FOR BLOOD PRESSURE SUPPORT)
80.0000 ug | PREFILLED_SYRINGE | INTRAVENOUS | Status: DC | PRN
  Administered 2011-06-25: 80 ug via INTRAVENOUS
  Filled 2011-06-25: qty 5

## 2011-06-25 MED ORDER — BENZOCAINE-MENTHOL 20-0.5 % EX AERO: 1.0000 "application " | INHALATION_SPRAY | CUTANEOUS | Status: DC | PRN

## 2011-06-25 MED ORDER — PHENYLEPHRINE 40 MCG/ML (10ML) SYRINGE FOR IV PUSH (FOR BLOOD PRESSURE SUPPORT): 80.0000 ug | PREFILLED_SYRINGE | INTRAVENOUS | Status: DC | PRN

## 2011-06-25 MED ORDER — DIPHENHYDRAMINE HCL 50 MG/ML IJ SOLN: 12.5000 mg | INTRAMUSCULAR | Status: DC | PRN

## 2011-06-25 MED ORDER — SENNOSIDES-DOCUSATE SODIUM 8.6-50 MG PO TABS
2.0000 | ORAL_TABLET | Freq: Every day | ORAL | Status: DC
  Administered 2011-06-25 – 2011-06-26 (×2): 2 via ORAL

## 2011-06-25 MED ORDER — ONDANSETRON HCL 4 MG/2ML IJ SOLN
INTRAMUSCULAR | Status: AC
  Filled 2011-06-25: qty 2

## 2011-06-25 MED ORDER — WITCH HAZEL-GLYCERIN EX PADS: 1.0000 "application " | MEDICATED_PAD | CUTANEOUS | Status: DC | PRN

## 2011-06-25 MED ORDER — TETANUS-DIPHTH-ACELL PERTUSSIS 5-2.5-18.5 LF-MCG/0.5 IM SUSP
0.5000 mL | Freq: Once | INTRAMUSCULAR | Status: AC
  Administered 2011-06-26: 0.5 mL via INTRAMUSCULAR
  Filled 2011-06-25: qty 0.5

## 2011-06-25 MED ORDER — LABETALOL HCL 5 MG/ML IV SOLN
20.0000 mg | INTRAVENOUS | Status: DC | PRN
  Administered 2011-06-26 (×3): 20 mg via INTRAVENOUS
  Filled 2011-06-25 (×4): qty 4

## 2011-06-25 MED ORDER — EPHEDRINE 5 MG/ML INJ
10.0000 mg | INTRAVENOUS | Status: DC | PRN
  Filled 2011-06-25: qty 4

## 2011-06-25 MED ORDER — DIBUCAINE 1 % RE OINT: 1.0000 "application " | TOPICAL_OINTMENT | RECTAL | Status: DC | PRN

## 2011-06-25 MED ORDER — IBUPROFEN 600 MG PO TABS
600.0000 mg | ORAL_TABLET | Freq: Four times a day (QID) | ORAL | Status: DC
  Administered 2011-06-26 – 2011-06-27 (×6): 600 mg via ORAL
  Filled 2011-06-25 (×7): qty 1

## 2011-06-25 MED ORDER — BUPIVACAINE HCL (PF) 0.25 % IJ SOLN
INTRAMUSCULAR | Status: DC | PRN
  Administered 2011-06-25: 10 mL

## 2011-06-25 MED ORDER — TETANUS-DIPHTH-ACELL PERTUSSIS 5-2.5-18.5 LF-MCG/0.5 IM SUSP: 0.5000 mL | Freq: Once | INTRAMUSCULAR | Status: DC

## 2011-06-25 MED ORDER — LACTATED RINGERS IV SOLN
INTRAVENOUS | Status: DC
  Administered 2011-06-25: 22:00:00 via INTRAVENOUS

## 2011-06-25 MED ORDER — LACTATED RINGERS IV SOLN
INTRAVENOUS | Status: DC | PRN
  Administered 2011-06-25: 13:00:00 via INTRAVENOUS

## 2011-06-25 MED ORDER — OXYCODONE-ACETAMINOPHEN 5-325 MG PO TABS
1.0000 | ORAL_TABLET | ORAL | Status: DC | PRN
  Administered 2011-06-26: 1 via ORAL
  Filled 2011-06-25: qty 1

## 2011-06-25 MED ORDER — DIPHENHYDRAMINE HCL 25 MG PO CAPS: 25.0000 mg | ORAL_CAPSULE | Freq: Four times a day (QID) | ORAL | Status: DC | PRN

## 2011-06-25 MED ORDER — LIDOCAINE HCL 1.5 % IJ SOLN
INTRAMUSCULAR | Status: DC | PRN
  Administered 2011-06-25 (×2): 5 mL via INTRADERMAL

## 2011-06-25 MED ORDER — OXYCODONE-ACETAMINOPHEN 5-325 MG PO TABS: 1.0000 | ORAL_TABLET | ORAL | Status: DC | PRN

## 2011-06-25 MED ORDER — DIBUCAINE 1 % RE OINT
1.0000 "application " | TOPICAL_OINTMENT | RECTAL | Status: DC | PRN
  Filled 2011-06-25: qty 28

## 2011-06-25 MED ORDER — ONDANSETRON HCL 4 MG/2ML IJ SOLN
INTRAMUSCULAR | Status: DC | PRN
  Administered 2011-06-25: 4 mg via INTRAVENOUS

## 2011-06-25 MED ORDER — IBUPROFEN 600 MG PO TABS: 600.0000 mg | ORAL_TABLET | Freq: Four times a day (QID) | ORAL | Status: DC

## 2011-06-25 MED ORDER — SODIUM BICARBONATE 8.4 % IV SOLN
INTRAVENOUS | Status: DC | PRN
  Administered 2011-06-25: 5 mL via EPIDURAL

## 2011-06-25 MED ORDER — FENTANYL 2.5 MCG/ML BUPIVACAINE 1/10 % EPIDURAL INFUSION (WH - ANES)
14.0000 mL/h | INTRAMUSCULAR | Status: DC
  Administered 2011-06-25: 14 mL/h via EPIDURAL
  Filled 2011-06-25 (×2): qty 60

## 2011-06-25 MED ORDER — ZOLPIDEM TARTRATE 5 MG PO TABS: 5.0000 mg | ORAL_TABLET | Freq: Every evening | ORAL | Status: DC | PRN

## 2011-06-25 MED ORDER — SENNOSIDES-DOCUSATE SODIUM 8.6-50 MG PO TABS: 2.0000 | ORAL_TABLET | Freq: Every day | ORAL | Status: DC

## 2011-06-25 MED ORDER — EPHEDRINE 5 MG/ML INJ: 10.0000 mg | INTRAVENOUS | Status: DC | PRN

## 2011-06-25 MED ORDER — ONDANSETRON HCL 4 MG/2ML IJ SOLN
4.0000 mg | INTRAMUSCULAR | Status: DC | PRN
Start: 2011-06-25 — End: 2011-06-25

## 2011-06-25 MED ORDER — LACTATED RINGERS IV SOLN
500.0000 mL | Freq: Once | INTRAVENOUS | Status: AC
  Administered 2011-06-25: 500 mL via INTRAVENOUS

## 2011-06-25 MED ORDER — LANOLIN HYDROUS EX OINT: TOPICAL_OINTMENT | CUTANEOUS | Status: DC | PRN

## 2011-06-25 MED ORDER — LIDOCAINE-EPINEPHRINE (PF) 2 %-1:200000 IJ SOLN
INTRAMUSCULAR | Status: AC
  Filled 2011-06-25: qty 20

## 2011-06-25 MED ORDER — LACTATED RINGERS IV SOLN
INTRAVENOUS | Status: DC
  Administered 2011-06-25: 03:00:00 via INTRAUTERINE

## 2011-06-25 MED ORDER — DEXAMETHASONE SODIUM PHOSPHATE 10 MG/ML IJ SOLN
INTRAMUSCULAR | Status: AC
  Filled 2011-06-25: qty 1

## 2011-06-25 MED ORDER — ZOLPIDEM TARTRATE 5 MG PO TABS
5.0000 mg | ORAL_TABLET | Freq: Every evening | ORAL | Status: DC | PRN
  Administered 2011-06-26: 5 mg via ORAL
  Filled 2011-06-25: qty 1

## 2011-06-25 MED ORDER — PRENATAL PLUS 27-1 MG PO TABS
1.0000 | ORAL_TABLET | Freq: Every day | ORAL | Status: DC
  Administered 2011-06-26 – 2011-06-27 (×2): 1 via ORAL
  Filled 2011-06-25 (×2): qty 1

## 2011-06-25 MED ORDER — BENZOCAINE-MENTHOL 20-0.5 % EX AERO
1.0000 "application " | INHALATION_SPRAY | CUTANEOUS | Status: DC | PRN
  Filled 2011-06-25: qty 56

## 2011-06-25 MED ORDER — CITRIC ACID-SODIUM CITRATE 334-500 MG/5ML PO SOLN
30.0000 mL | Freq: Once | ORAL | Status: AC
  Administered 2011-06-25: 30 mL via ORAL
  Filled 2011-06-25: qty 15

## 2011-06-25 MED ORDER — DEXAMETHASONE SODIUM PHOSPHATE 10 MG/ML IJ SOLN
INTRAMUSCULAR | Status: DC | PRN
  Administered 2011-06-25: 10 mg via INTRAVENOUS

## 2011-06-25 SURGICAL SUPPLY — 30 items
ADH SKN CLS LQ APL DERMABOND (GAUZE/BANDAGES/DRESSINGS)
APL SKNCLS STERI-STRIP NONHPOA (GAUZE/BANDAGES/DRESSINGS) ×1
BENZOIN TINCTURE PRP APPL 2/3 (GAUZE/BANDAGES/DRESSINGS) ×2 IMPLANT
CHLORAPREP W/TINT 26ML (MISCELLANEOUS) ×2 IMPLANT
CLOTH BEACON ORANGE TIMEOUT ST (SAFETY) ×2 IMPLANT
CONTAINER PREFILL 10% NBF 15ML (MISCELLANEOUS) ×4 IMPLANT
DERMABOND ADHESIVE PROPEN (GAUZE/BANDAGES/DRESSINGS)
DERMABOND ADVANCED .7 DNX6 (GAUZE/BANDAGES/DRESSINGS) IMPLANT
DRAPE UTILITY XL STRL (DRAPES) ×2 IMPLANT
DRESSING COVERLET 3X1 FLEXIBLE (GAUZE/BANDAGES/DRESSINGS) ×1 IMPLANT
ELECT REM PT RETURN 9FT ADLT (ELECTROSURGICAL) ×2
ELECTRODE REM PT RTRN 9FT ADLT (ELECTROSURGICAL) ×1 IMPLANT
GLOVE BIOGEL PI IND STRL 7.0 (GLOVE) ×2 IMPLANT
GLOVE BIOGEL PI INDICATOR 7.0 (GLOVE) ×2
GLOVE ECLIPSE 6.5 STRL STRAW (GLOVE) ×2 IMPLANT
GOWN PREVENTION PLUS LG XLONG (DISPOSABLE) ×4 IMPLANT
NDL HYPO 25X1 1.5 SAFETY (NEEDLE) ×1 IMPLANT
NEEDLE HYPO 25X1 1.5 SAFETY (NEEDLE) ×2 IMPLANT
NS IRRIG 1000ML POUR BTL (IV SOLUTION) ×2 IMPLANT
PACK ABDOMINAL MINOR (CUSTOM PROCEDURE TRAY) ×2 IMPLANT
PENCIL BUTTON HOLSTER BLD 10FT (ELECTRODE) ×2 IMPLANT
SPONGE LAP 4X18 X RAY DECT (DISPOSABLE) IMPLANT
STRIP CLOSURE SKIN 1/4X3 (GAUZE/BANDAGES/DRESSINGS) ×2 IMPLANT
SUT CHROMIC GUT AB #0 18 (SUTURE) ×2 IMPLANT
SUT MNCRL AB 4-0 PS2 18 (SUTURE) ×2 IMPLANT
SUT VICRYL 0 UR6 27IN ABS (SUTURE) ×2 IMPLANT
SYR CONTROL 10ML LL (SYRINGE) ×2 IMPLANT
TOWEL OR 17X24 6PK STRL BLUE (TOWEL DISPOSABLE) ×4 IMPLANT
TRAY FOLEY CATH 14FR (SET/KITS/TRAYS/PACK) ×2 IMPLANT
WATER STERILE IRR 1000ML POUR (IV SOLUTION) ×2 IMPLANT

## 2011-06-25 NOTE — Progress Notes (Signed)
Leslie Decker is a 30 y.o. [id number] at [redacted]w[redacted]d admitted for induction of labor due to hypertension and presumptive preeclampsia.  Subjective: C/o pain.  Asking for epidural now but initially was reluctant because she wanted to see what it was like to feel the pain.  Objective: BP 147/70  Pulse 104  Temp(Src) 98.4 F (36.9 C) (Oral)  Resp 22  Ht 5\' 6"  (1.676 m)  Wt 133.358 kg (294 lb)  BMI 47.45 kg/m2  SpO2 93% I/O last 3 completed shifts: In: 3003.5 [P.O.:850; I.V.:2153.5] Out: 3105 [Urine:3105] Total I/O In: 1060.3 [P.O.:60; I.V.:1000.3] Out: 850 [Urine:850]  FHT:  FHR: 120's bpm, variability: moderate,  Small accelerations:  Present,  Early decelerations:  Present  UC:   regular, every 2 minutes SVE:   Dilation: 6 Effacement (%): 80 Station: -2 Exam by:: Leslie Morin, MD AROM no fluid - suspect pt is already ruptured c/o leaking early to RN but fern was negative. IUPC placed and FSE (secondary to LOC)  Labs: Lab Results  Component Value Date   WBC 7.7 06/24/2011   HGB 11.6* 06/24/2011   HCT 34.7* 06/24/2011   MCV 80.3 06/24/2011   PLT 209 06/24/2011    Assessment / Plan: 30yo P2 @ 40 1/7wks undergoing induction. Continue to titrate pitocin per protocol.  Fetal status is overall reassuring.  Will amnioinfuse secondary to mild variables.  Consult anesthesia for epidural.  BP is ok and currently on MgSO4 for seizure prophylaxis.  Labs yest evening were wnl and pt is undergoing 24hr urine collection.    Leslie Decker Y 06/25/2011, 2:45 AM

## 2011-06-25 NOTE — Anesthesia Postprocedure Evaluation (Signed)
  Anesthesia Post-op Note  Patient: Leslie Decker  Procedure(s) Performed: * Lumbar Epidural for L&D *  Patient Location: Short Stay  Anesthesia Type: Epidural  Level of Consciousness: awake, alert  and oriented  Airway and Oxygen Therapy: Patient Spontanous Breathing  Post-op Pain: none  Post-op Assessment: Post-op Vital signs reviewed, Patient's Cardiovascular Status Stable, Respiratory Function Stable, Patent Airway, No signs of Nausea or vomiting, Adequate PO intake, Pain level controlled, No headache, No backache, No residual numbness and No residual motor weakness  Post-op Vital Signs: Reviewed and stable  Complications: No apparent anesthesia complications

## 2011-06-25 NOTE — Anesthesia Procedure Notes (Signed)
Epidural Patient location during procedure: OB Start time: 06/25/2011 3:11 AM  Staffing Performed by: anesthesiologist   Preanesthetic Checklist Completed: patient identified, site marked, surgical consent, pre-op evaluation, timeout performed, IV checked, risks and benefits discussed and monitors and equipment checked  Epidural Patient position: sitting Prep: site prepped and draped and DuraPrep Patient monitoring: continuous pulse ox and blood pressure Approach: midline Injection technique: LOR air and LOR saline  Needle:  Needle type: Tuohy  Needle gauge: 17 G Needle length: 9 cm Needle insertion depth: 9 cm Catheter type: closed end flexible Catheter size: 19 Gauge Catheter at skin depth: 14 cm Test dose: negative  Assessment Events: blood not aspirated, injection not painful, no injection resistance, negative IV test and no paresthesia  Additional Notes Patient identified.  Risk benefits discussed including failed block, incomplete pain control, headache, nerve damage, paralysis, blood pressure changes, nausea, vomiting, reactions to medication both toxic or allergic, and postpartum back pain.  Patient expressed understanding and wished to proceed.  All questions were answered.  Sterile technique used throughout procedure and epidural site dressed with sterile barrier dressing. No paresthesia or other complications noted.The patient did not experience any signs of intravascular injection such as tinnitus or metallic taste in mouth nor signs of intrathecal spread such as rapid motor block. Please see nursing notes for vital signs.

## 2011-06-25 NOTE — Brief Op Note (Signed)
  06/25/2011  2:43 PM  PATIENT:  Leslie Decker  30 y.o. female  PRE-OPERATIVE DIAGNOSIS:  desires sterilization  POST-OPERATIVE DIAGNOSIS:  desires sterilization  PROCEDURE:  Procedure(s): POST PARTUM TUBAL LIGATION  SURGEON:  Surgeon(s): Esmeralda Arthur, MD MD  ASSISTANTS: none   ANESTHESIA:   epidural  LOCAL MEDICATIONS USED:  MARCAINE 10CC  SPECIMEN:  2 segments of tube  DISPOSITION OF SPECIMEN:  PATHOLOGY  COUNTS:  YES  ESTIMATED BLOOD LOSS: minimal   PATIENT DISPOSITION:  PACU - hemodynamically stable.       [id number] A MD 06/25/2011 2:43 PM

## 2011-06-25 NOTE — Anesthesia Preprocedure Evaluation (Addendum)
Anesthesia Evaluation  Name, MR# and DOB Patient awake  General Assessment Comment  Reviewed: Allergy & Precautions, H&P , Patient's Chart, lab work & pertinent test results  Airway Mallampati: IV TM Distance: >3 FB Neck ROM: full    Dental No notable dental hx.    Pulmonary asthma  clear to auscultation  Pulmonary exam normal       Cardiovascular regular Normal    Neuro/Psych Negative Neurological ROS  Negative Psych ROS   GI/Hepatic negative GI ROS Neg liver ROS  GERD   Endo/Other  Negative Endocrine ROSMorbid obesity  Renal/GU negative Renal ROS     Musculoskeletal   Abdominal   Peds  Hematology negative hematology ROS (+)   Anesthesia Other Findings Preeclampsia scoliosis  Reproductive/Obstetrics (+) Pregnancy                          Anesthesia Physical Anesthesia Plan  ASA: III  Anesthesia Plan: Epidural   Post-op Pain Management:    Induction:   Airway Management Planned:   Additional Equipment:   Intra-op Plan:   Post-operative Plan:   Informed Consent: I have reviewed the patients History and Physical, chart, labs and discussed the procedure including the risks, benefits and alternatives for the proposed anesthesia with the patient or authorized representative who has indicated his/her understanding and acceptance.     Plan Discussed with:   Anesthesia Plan Comments:         Anesthesia Quick Evaluation

## 2011-06-25 NOTE — Plan of Care (Signed)
Patient to pre-op for scheduled tubal ligation via stretcher. Report given to RN.

## 2011-06-25 NOTE — Anesthesia Postprocedure Evaluation (Signed)
  Anesthesia Post-op Note  Patient: Leslie Decker  Procedure(s) Performed:  POST PARTUM TUBAL LIGATION  Patient Location: PACU  Anesthesia Type: Epidural  Level of Consciousness: awake, alert  and oriented  Airway and Oxygen Therapy: Patient Spontanous Breathing  Post-op Pain: none  Post-op Assessment: Post-op Vital signs reviewed, Patient's Cardiovascular Status Stable, Respiratory Function Stable, Patent Airway, No signs of Nausea or vomiting, Pain level controlled, No headache, No backache, No residual numbness and No residual motor weakness  Post-op Vital Signs: Reviewed and stable  Complications: No apparent anesthesia complications

## 2011-06-25 NOTE — Transfer of Care (Signed)
Immediate Anesthesia Transfer of Care Note  Patient: Leslie Decker  Procedure(s) Performed:  POST PARTUM TUBAL LIGATION  Patient Location: PACU  Anesthesia Type: Epidural  Level of Consciousness: awake, alert  and oriented  Airway & Oxygen Therapy: Patient Spontanous Breathing and Patient connected to nasal cannula oxygen  Post-op Assessment: Report given to PACU RN and Post -op Vital signs reviewed and stable  Post vital signs: stable  Complications: No apparent anesthesia complications

## 2011-06-25 NOTE — Op Note (Signed)
Preop diagnosis: Desire for sterilization  Postop diagnosis: Same  Anesthesia: Epidural  Anesthesiologist: Dr. Malen Gauze  Procedure: Postpartum bilateral tubal ligation  Surgeon: Dr. Estanislado Pandy  Asst.: None  Estimated blood loss: Minimal  Procedure:  After being informed of the planned procedure with possible complications, including bleeding, infection, injury to other organs, irreversibility of tubal ligation and failure rate of 1 in 500,  informed consent is obtained and patient is taken to OR #4.  She is given epidural anesthesia without any complication with the previously placed epidural catheter. She is then placed in  dorsal decubitus position, prepped and draped in a sterile fashion. A Foley catheter is already in place.  After assessing adequate level of anesthesia, we infiltrate the umbilical area using  10 cc of Marcaine 0.25. We then perform a semi-elliptical incision which was brought down bluntly to the fascia. The fascia is identified and grasped with Coker forceps and incised with Mayo scissors. Peritoneum is entered bluntly. Using Trendelenburg position, moist packing and long retractors, we are able to identify the left tube which was then grasped with  Babcock forceps and exteriorized until the fimbria is visualized. We enter the mesosalpinx with cauterization. We then doubly ligate the proximal stump and the distal stump with 0 chromic. A section of 1.5 cm of isthmo-ampullary  tube is then resected. Both stumps are cauterized and hemostasis is adequate. We proceed in the exact same fashion on the right side again after having  visualized the the fimbriae.  All retractors and sponges are removed. The fascia is closed with a running suture of 0 Vicryl. The skin is closed with a subcuticular suture of 3-0 Monocryl and Dermabond.  Instrument and sponge count was complete x2. Estimated blood loss is minimal. The procedure is well tolerated by the patient who is taken to recovery  room in a well and stable condition.  Specimen: 2 segments of tube sent to pathology.

## 2011-06-25 NOTE — Anesthesia Preprocedure Evaluation (Signed)
Anesthesia Evaluation  Name, MR# and DOB Patient awake  General Assessment Comment  Reviewed: Allergy & Precautions, H&P , NPO status , Patient's Chart, lab work & pertinent test results  Airway Mallampati: IV TM Distance: >3 FB Neck ROM: full    Dental No notable dental hx.    Pulmonary asthma  clear to auscultation  Pulmonary exam normal       Cardiovascular regular Normal    Neuro/Psych Negative Neurological ROS  Negative Psych ROS   GI/Hepatic negative GI ROS Neg liver ROS  GERD   Endo/Other  Negative Endocrine ROSMorbid obesity  Renal/GU negative Renal ROS  Genitourinary negative   Musculoskeletal negative musculoskeletal ROS (+)   Abdominal   Peds  Hematology negative hematology ROS (+)   Anesthesia Other Findings Preeclampsia scoliosis  Reproductive/Obstetrics negative OB ROS (+) Pregnancy                           Anesthesia Physical  Anesthesia Plan  ASA: III  Anesthesia Plan: Epidural   Post-op Pain Management:    Induction:   Airway Management Planned:   Additional Equipment:   Intra-op Plan:   Post-operative Plan:   Informed Consent: I have reviewed the patients History and Physical, chart, labs and discussed the procedure including the risks, benefits and alternatives for the proposed anesthesia with the patient or authorized representative who has indicated his/her understanding and acceptance.     Plan Discussed with:   Anesthesia Plan Comments:         Anesthesia Quick Evaluation

## 2011-06-26 ENCOUNTER — Encounter (HOSPITAL_COMMUNITY): Payer: Self-pay | Admitting: Obstetrics and Gynecology

## 2011-06-26 LAB — CREATININE, URINE, 24 HOUR: Urine Total Volume-UCRE24: 4475 mL

## 2011-06-26 MED ORDER — LABETALOL HCL 200 MG PO TABS
200.0000 mg | ORAL_TABLET | Freq: Two times a day (BID) | ORAL | Status: DC
  Administered 2011-06-26 – 2011-06-27 (×2): 200 mg via ORAL
  Filled 2011-06-26 (×2): qty 1

## 2011-06-26 MED ORDER — LABETALOL HCL 200 MG PO TABS
200.0000 mg | ORAL_TABLET | Freq: Two times a day (BID) | ORAL | Status: DC
  Filled 2011-06-26 (×2): qty 1

## 2011-06-26 NOTE — Progress Notes (Signed)
Encounter addended by: Karleen Dolphin on: 06/26/2011 10:16 AM<BR>     Documentation filed: Notes Section, Charges VN

## 2011-06-26 NOTE — Anesthesia Postprocedure Evaluation (Signed)
  Anesthesia Post-op Note  Patient: Leslie Decker  Procedure(s) Performed: *Patient Location: PACU and ICU  Anesthesia Type: Epidural  Level of Consciousness: awake, alert  and oriented  Airway and Oxygen Therapy: Patient Spontanous Breathing  Post-op Pain: none  Post-op Assessment: Post-op Vital signs reviewed, Patient's Cardiovascular Status Stable, No headache, No backache, No residual numbness and No residual motor weakness  Post-op Vital Signs: Reviewed and stable  Complications: No apparent anesthesia complications

## 2011-06-26 NOTE — Plan of Care (Signed)
Problem: Phase I Progression Outcomes Goal: Other Phase I Outcomes/Goals Outcome: Progressing Post magnesium measuring output and monitoring B/P's.  Problem: Phase II Progression Outcomes Goal: Incision intact & without signs/symptoms of infection Outcome: Completed/Met Date Met:  06/26/11 BTL incision closed w/ skin glue intact and dry.

## 2011-06-26 NOTE — Progress Notes (Signed)
Post Partum Day 1, POD 1 BTL Subjective: no complaints, up ad lib and tolerating PO  Objective: Blood pressure 142/79, pulse 82, temperature 98.2 F (36.8 C), temperature source Oral, resp. rate 22, height 5\' 4"  (1.626 m), weight 129.729 kg (286 lb), SpO2 99.00%, unknown if currently breastfeeding.  Physical Exam:  General: alert and cooperative Lochia: appropriate Uterine Fundus: firm Incision: healing well, no significant drainage, no significant erythema DVT Evaluation: No evidence of DVT seen on physical exam. Negative Homan's sign. Chest Clear Abd: Nontender Reflexes: Normal  24 Hour Urine Protein: pending  Labs: OK   Basename 06/25/11 0800 06/24/11 1952  HGB 11.7* 11.6*  HCT 35.3* 34.7*    Assessment/Plan: Plan for discharge tomorrow. DC Magn. To the floor later today. Check 24 urine protein.   LOS: 3 days   Idrees Quam V 06/26/2011, 1:13 PM

## 2011-06-26 NOTE — Progress Notes (Signed)
UR chart review completed.  

## 2011-06-27 DIAGNOSIS — O1490 Unspecified pre-eclampsia, unspecified trimester: Secondary | ICD-10-CM

## 2011-06-27 MED ORDER — OXYCODONE-ACETAMINOPHEN 5-325 MG PO TABS: 1.0000 | ORAL_TABLET | ORAL | Status: AC | PRN

## 2011-06-27 MED ORDER — IBUPROFEN 600 MG PO TABS: 600.0000 mg | ORAL_TABLET | Freq: Four times a day (QID) | ORAL | Status: AC | PRN

## 2011-06-27 MED ORDER — HYDROCHLOROTHIAZIDE 12.5 MG PO TABS: 25.0000 mg | ORAL_TABLET | Freq: Every day | ORAL | Status: DC

## 2011-06-27 MED ORDER — LABETALOL HCL 200 MG PO TABS: 200.0000 mg | ORAL_TABLET | Freq: Two times a day (BID) | ORAL | Status: DC

## 2011-06-27 NOTE — Discharge Summary (Signed)
Obstetric Discharge Summary Reason for Admission: induction of labor for chronic HTN with superimposed pre-eclampsia Prenatal Procedures: NST and ultrasound Intrapartum Procedures: spontaneous vaginal delivery and tubal ligation Postpartum Procedures: P.P. tubal ligation, magnesium sulfate x 24 hours after delivery Complications-Operative and Postpartum: none Hemoglobin  Date Value Range Status  06/25/2011 11.7* 12.0-15.0 (g/dL) Final     HCT  Date Value Range Status  06/25/2011 35.3* 36.0-46.0 (%) Final    Hospital Course:  Patient was admitted on 06/23/11 for induction due to chronic HTN and superimposed pre-eclampsia.  She was also desiring a pp BTL.  She was on Labetolol 200 mg po bid prior to hospitalization, and this was continued during her hospital course.  Cervix was 2 1/2 cm, and Cervidil was placed that evening.  Pitocin was begun the following morning, and magnesium sulfate infusion was also initiated.  Patient had an epidural placed later that evening.  She progressed well to delivery at 4:52 am, with delivery of a viable female, wt 6+9, Apgars 9/9, by Dr. Su Hilt.  Magnesium sulfate was continued for 24 hours after delivery, during which time the 24 hour urine was resulted at 627 mg protein/24 hours.  Patient was also continued on Labetolol.  lher weight on 10 4/12 was 285.9, with previous weight in office on 9/27 of 290.  Her BPs postpartum were in the 118-149/68-90 range by pp day 2.  She was up ad lib, voiding without difficulty in large amounts, and denied PIH symptoms.  She had a BTL on 10/3 with Dr. Estanislado Pandy. By pp day 2, she was doing well, and was ready for discharge. Dr. Pennie Rushing was consulted regarding plans for postpartum BP meds and follow-up, with plan made to continue Labetolol per previous dosing regimen and HCTZ 25 mg po q day x 7 days.   Discharge Diagnoses: Term Pregnancy-delivered and Preelampsia, desired BTL  Discharge Information: Date: 06/27/2011 Activity: Per  [facility] handout Diet: routine Medications: Ibuprofen, Percocet and Labetolol. HCTZ 25 mg po q day x 7 days with OJ Condition: stable Instructions: refer to practice specific booklet Discharge to: home Follow-up Information    Follow up with [facility] in 6 weeks. (As needed if symptoms worsen)        [facility] nurse will see patient early next week for BP evaluation.  Newborn Data: Live born female  Birth Weight: 6 lb 9.8 oz (3000 g) APGAR: 9, 9  Home with mother.  Leslie Decker 06/27/2011, 9:09 AM

## 2011-06-27 NOTE — Progress Notes (Signed)
Post Partum Day 2 Subjective: no complaints.  Up ad lib.  Ready for discharge.  No PIH symptoms.  Feedings going well.  Pain well-controlled with po meds.  Objective: Blood pressure 141/90, pulse 90, temperature 97.9 F (36.6 C), temperature source Oral, resp. rate 22, height 5\' 4"  (1.626 m), weight 129.729 kg (286 lb), SpO2 97.00%, unknown if currently breastfeeding.  Physical Exam:  General: alert Lochia: appropriate Uterine Fundus: firm Incision: Perineum intact.  BTL incision CDI DVT Evaluation: No evidence of DVT seen on physical exam. Negative Homan's sign.   Basename 06/25/11 0800 06/24/11 1952  HGB 11.7* 11.6*  HCT 35.3* 34.7*    Assessment/Plan: Discharge home Will continue Labetolol 200 mg po bid, HCTZ 25 mg po q day with OJ, Ibuprophen, and Percocet. Weigh today for documentation. Consulted with Dr. Pennie Rushing regarding further BP management and follow-up. Will have [facility] nurse see patient early next week for BP check--office will be notified of result. Patient will follow-up at [facility] in 6 weeks or prn. PIH/pre-eclampsia precautions reviewed with patient.   LOS: 4 days   Leslie Decker 06/27/2011, 9:24 AM

## 2011-06-27 NOTE — Progress Notes (Signed)

## 2011-07-01 LAB — PROTEIN, URINE, 24 HOUR
Collection Interval-UPROT: 24 hours
Protein, 24H Urine: 627 mg/d — ABNORMAL HIGH (ref 50–100)
Protein, Urine: 14 mg/dL
Urine Total Volume-UPROT: 4475 mL

## 2011-07-07 LAB — CBC
HCT: 33.7 — ABNORMAL LOW
Hemoglobin: 11.2 — ABNORMAL LOW
Hemoglobin: 11.3 — ABNORMAL LOW
MCHC: 33.1
MCV: 82.3
MCV: 83.2
Platelets: 199
RBC: 4.08
RDW: 13.7
WBC: 7.4
WBC: 7.7

## 2011-07-07 LAB — RPR: RPR Ser Ql: NONREACTIVE

## 2013-01-31 ENCOUNTER — Emergency Department (HOSPITAL_COMMUNITY)
Admission: EM | Admit: 2013-01-31 | Discharge: 2013-01-31 | Disposition: A | Discharge status: Home / Self Care | Payer: Self-pay | Attending: Emergency Medicine | Admitting: Emergency Medicine

## 2013-01-31 DIAGNOSIS — J329 Chronic sinusitis, unspecified: Secondary | ICD-10-CM

## 2013-01-31 DIAGNOSIS — J45909 Unspecified asthma, uncomplicated: Secondary | ICD-10-CM | POA: Insufficient documentation

## 2013-01-31 DIAGNOSIS — R52 Pain, unspecified: Secondary | ICD-10-CM | POA: Insufficient documentation

## 2013-01-31 DIAGNOSIS — J029 Acute pharyngitis, unspecified: Secondary | ICD-10-CM | POA: Insufficient documentation

## 2013-01-31 DIAGNOSIS — J019 Acute sinusitis, unspecified: Secondary | ICD-10-CM | POA: Insufficient documentation

## 2013-01-31 MED ORDER — AMOXICILLIN 500 MG PO CAPS
1000.0000 mg | ORAL_CAPSULE | Freq: Once | ORAL | Status: AC
  Administered 2013-01-31: 1000 mg via ORAL
  Filled 2013-01-31: qty 1

## 2013-01-31 MED ORDER — AMOXICILLIN 500 MG PO CAPS: 1000.0000 mg | ORAL_CAPSULE | Freq: Three times a day (TID) | ORAL | Status: DC

## 2013-01-31 MED ORDER — DEXAMETHASONE 6 MG PO TABS
12.0000 mg | ORAL_TABLET | Freq: Once | ORAL | Status: AC
  Administered 2013-01-31: 12 mg via ORAL
  Filled 2013-01-31: qty 2

## 2013-01-31 NOTE — ED Notes (Signed)
Pt c/o generalized body aches, sore throat, headache.

## 2013-01-31 NOTE — ED Provider Notes (Signed)
History     CSN: [id number]  Arrival date & time 01/31/13  0239   First MD Initiated Contact with Patient 01/31/13 0315      Chief Complaint  Patient presents with  . Influenza    (Consider location/radiation/quality/duration/timing/severity/associated sxs/prior treatment) Patient is a 32 y.o. female presenting with flu symptoms. The history is provided by the patient.  Influenza She had onset last night of nasal congestion, yellow rhinorrhea, sore throat and generalized body aches. She denies fever, chills, sweats. She denies cough. She denies nausea, vomiting, diarrhea. Pain was moderate and she rated at 6/10. She tried taking over-the-counter cold medication which did not seem to give her any relief. She denies any sick contacts.   Past Medical History  Diagnosis Date  . Asthma     Past Surgical History  Procedure Laterality Date  . No past surgeries    . Tubal ligation  06/25/2011    Procedure: POST PARTUM TUBAL LIGATION;  Surgeon: Esmeralda Arthur, MD;  Location: [facility];  Service: Gynecology;  Laterality: Bilateral;    Family History  Problem Relation Age of Onset  . Hypertension Maternal Grandmother   . Diabetes Maternal Grandmother     History  Substance Use Topics  . Smoking status: Never Smoker   . Smokeless tobacco: Not on file  . Alcohol Use: No    OB History   Grav Para Term Preterm Abortions TAB SAB Ect Mult Living   6 3 3  3 3    3       Review of Systems  All other systems reviewed and are negative.    Allergies  Review of patient's allergies indicates no known allergies.  Home Medications   Current Outpatient Rx  Name  Route  Sig  Dispense  Refill  . DM-Phenylephrine-Acetaminophen (VICKS DAYQUIL COLD & FLU) 10-5-325 MG CAPS   Oral   Take 1-2 capsules by mouth every 6 (six) hours as needed (flu like symptoms).           BP 139/85  Pulse 93  Temp(Src) 99 F (37.2 C) (Oral)  Resp 16  Ht 5\' 6"  (1.676 m)  Wt 300 lb (136.079 kg)  BMI  48.44 kg/m2  SpO2 93%  LMP 01/27/2013  Breastfeeding? No  Physical Exam  Nursing note and vitals reviewed.  Morbidly obese 32 year old female, resting comfortably and in no acute distress. Vital signs are normal. Oxygen saturation is 93%, which is normal. Head is normocephalic and atraumatic. PERRLA, EOMI. Oropharynx is mildly erythematous without exudate. She's had difficulty with secretions and phonation is normal. There is moderate tenderness over maxillary sinuses. Nasal turbinates are edematous with slightly purulent drainage. Neck is nontender and supple without adenopathy or JVD. Back is nontender and there is no CVA tenderness. Lungs are clear without rales, wheezes, or rhonchi. Chest is nontender. Heart has regular rate and rhythm without murmur. Abdomen is soft, flat, nontender without masses or hepatosplenomegaly and peristalsis is normoactive. Extremities have no cyanosis or edema, full range of motion is present. Skin is warm and dry without rash. Neurologic: Mental status is normal, cranial nerves are intact, there are no motor or sensory deficits.  ED Course  Procedures (including critical care time)   1. Sinusitis       MDM  Symptom complex most consistent with acute sinusitis. Throat pain might be related to strep infection but there is no need to do a strep screen since she will be started on antibiotics which would also  successfully treat strep. She is discharged with prescription for amoxicillin and she is to use over-the-counter ibuprofen and acetaminophen as needed for pain and fever.        Dione Booze, MD 01/31/13 [phone]

## 2013-11-24 ENCOUNTER — Emergency Department (HOSPITAL_COMMUNITY)
Admission: EM | Admit: 2013-11-24 | Discharge: 2013-11-24 | Disposition: A | Discharge status: Home / Self Care | Payer: Medicaid Other | Attending: Emergency Medicine | Admitting: Emergency Medicine

## 2013-11-24 ENCOUNTER — Encounter (HOSPITAL_COMMUNITY): Payer: Self-pay | Admitting: Emergency Medicine

## 2013-11-24 DIAGNOSIS — R0981 Nasal congestion: Secondary | ICD-10-CM

## 2013-11-24 DIAGNOSIS — J45909 Unspecified asthma, uncomplicated: Secondary | ICD-10-CM | POA: Insufficient documentation

## 2013-11-24 DIAGNOSIS — J069 Acute upper respiratory infection, unspecified: Secondary | ICD-10-CM

## 2013-11-24 DIAGNOSIS — B9789 Other viral agents as the cause of diseases classified elsewhere: Secondary | ICD-10-CM

## 2013-11-24 DIAGNOSIS — J329 Chronic sinusitis, unspecified: Secondary | ICD-10-CM | POA: Insufficient documentation

## 2013-11-24 MED ORDER — FLUTICASONE PROPIONATE 50 MCG/ACT NA SUSP: 2.0000 | Freq: Every day | NASAL | Status: DC

## 2013-11-24 MED ORDER — GUAIFENESIN ER 600 MG PO TB12: 1200.0000 mg | ORAL_TABLET | Freq: Two times a day (BID) | ORAL | Status: DC

## 2013-11-24 MED ORDER — OXYMETAZOLINE HCL 0.05 % NA SOLN
1.0000 | Freq: Once | NASAL | Status: AC
  Administered 2013-11-24: 1 via NASAL
  Filled 2013-11-24: qty 15

## 2013-11-24 NOTE — ED Provider Notes (Signed)
CSN: [id number]     Arrival date & time 11/24/13  1929 History  This chart was scribed for non-physician practitioner Dierdre ForthHannah Carlyn Mullenbach, PA-C working with Rolland PorterMark James, MD by Dorothey Basemania Sutton, ED Scribe. This patient was seen in room WTR7/WTR7 and the patient's care was started at 8:11 PM.    Chief Complaint  Patient presents with  . Sore Throat   The history is provided by the patient and medical records. No language interpreter was used.   HPI Comments: Leslie Decker is a 33 y.o. female who presents to the Emergency Department complaining of congestion with associated postnasal drip and sinus pressure onset 2 days ago with sore throat onset last night. Patient reports taking DayQuil at home with mild, temporary relief. She denies any known sick contacts. She denies fever, chills, nausea, emesis, ear pain, cough, congestion. She denies any allergies to medications. Patient has a history of asthma.   Past Medical History  Diagnosis Date  . Asthma    Past Surgical History  Procedure Laterality Date  . No past surgeries    . Tubal ligation  06/25/2011    Procedure: POST PARTUM TUBAL LIGATION;  Surgeon: Esmeralda ArthurSandra A Rivard, MD;  Location: [facility];  Service: Gynecology;  Laterality: Bilateral;   Family History  Problem Relation Age of Onset  . Hypertension Maternal Grandmother   . Diabetes Maternal Grandmother    History  Substance Use Topics  . Smoking status: Never Smoker   . Smokeless tobacco: Not on file  . Alcohol Use: No   OB History   Grav Para Term Preterm Abortions TAB SAB Ect Mult Living   6 3 3  3 3    3      Review of Systems  Constitutional: Negative for fever and chills.  HENT: Positive for congestion, postnasal drip, sinus pressure and sore throat. Negative for ear pain.   Gastrointestinal: Negative for nausea and vomiting.  All other systems reviewed and are negative.    Allergies  Review of patient's allergies indicates no known allergies.  Home Medications    Current Outpatient Rx  Name  Route  Sig  Dispense  Refill  . fluticasone (FLONASE) 50 MCG/ACT nasal spray   Each Nare   Place 2 sprays into both nostrils daily.   16 g   0   . guaiFENesin (MUCINEX) 600 MG 12 hr tablet   Oral   Take 2 tablets (1,200 mg total) by mouth 2 (two) times daily.   20 tablet   0     Triage Vitals: BP 148/87  Pulse 94  Temp(Src) 98.1 F (36.7 C) (Oral)  Resp 18  Ht 5\' 6"  (1.676 m)  Wt 274 lb 7.6 oz (124.5 kg)  BMI 44.32 kg/m2  SpO2 98%  LMP 11/17/2013  Physical Exam  Nursing note and vitals reviewed. Constitutional: She is oriented to person, place, and time. She appears well-developed and well-nourished. No distress.  Awake, alert, nontoxic appearance  HENT:  Head: Normocephalic and atraumatic.  Right Ear: Hearing, tympanic membrane, external ear and ear canal normal.  Left Ear: Hearing, tympanic membrane, external ear and ear canal normal.  Nose: Mucosal edema and rhinorrhea present. Right sinus exhibits no maxillary sinus tenderness and no frontal sinus tenderness. Left sinus exhibits no maxillary sinus tenderness and no frontal sinus tenderness.  Mouth/Throat: Uvula is midline and mucous membranes are normal. Mucous membranes are not dry. No uvula swelling. Posterior oropharyngeal erythema present. No oropharyngeal exudate, posterior oropharyngeal edema or tonsillar abscesses.  Pharyngeal  and uvular erythema. No swelling. Uvula is midline.  Moist mucous membranes  Eyes: Conjunctivae are normal. No scleral icterus.  Neck: Normal range of motion. Neck supple.  Cardiovascular: Normal rate, regular rhythm, normal heart sounds and intact distal pulses.   No murmur heard. Pulmonary/Chest: Effort normal and breath sounds normal. No respiratory distress. She has no wheezes. She has no rales.  Musculoskeletal: Normal range of motion. She exhibits no edema.  Neurological: She is alert and oriented to person, place, and time. She exhibits normal  muscle tone. Coordination normal.  Speech is clear and goal oriented Moves extremities without ataxia  Skin: Skin is warm and dry. She is not diaphoretic. No erythema.  Psychiatric: She has a normal mood and affect.    ED Course  Procedures (including critical care time)  DIAGNOSTIC STUDIES: Oxygen Saturation is 98% on room air, normal by my interpretation.    COORDINATION OF CARE: 8:14 PM- Discussed that symptoms are likely due to a viral URI. Will discharge patient with Mucinex and Flonase to manage symptoms. Advised patient of symptomatic care at home. Discussed treatment plan with patient at bedside and patient verbalized agreement.     Labs Review Labs Reviewed - No data to display Imaging Review No results found.   EKG Interpretation None      MDM   Final diagnoses:  Viral URI  Nasal congestion  Viral sinusitis   [name] presents with URI symptoms without cough.  Pt with 24 hours of symptoms and no sinus tenderness, no fever or tachycardia.  Highly doubt bacterial sinusitis.  Mild to moderate symptoms of clear/yellow nasal discharge/congestion and scratchy throat with cough for less than 10 days.  Patient is afebrile.  No concern for acute bacterial rhinosinusitis; likely viral in nature.  Patient discharged with symptomatic treatment.  Patient instructions given for warm saline nasal washes.  Recommendations for follow-up with primary care physician.    It has been determined that no acute conditions requiring further emergency intervention are present at this time. The patient/guardian have been advised of the diagnosis and plan. We have discussed signs and symptoms that warrant return to the ED, such as changes or worsening in symptoms.   Vital signs are stable at discharge.   BP 148/87  Pulse 94  Temp(Src) 98.1 F (36.7 C) (Oral)  Resp 18  Ht 5\' 6"  (1.676 m)  Wt 274 lb 7.6 oz (124.5 kg)  BMI 44.32 kg/m2  SpO2 98%  LMP 11/17/2013  Patient/guardian  has voiced understanding and agreed to follow-up with the PCP or specialist.    I personally performed the services described in this documentation, which was scribed in my presence. The recorded information has been reviewed and is accurate.    Dahlia Client Kang Ishida, PA-C 11/24/13 2025

## 2013-11-24 NOTE — ED Notes (Addendum)
Patient states about 2 days ago she began having upper resp congestion she thought was a sinus infection. Now patient has a sore throat and complains of thick mucous to her mouth. Denies fever, denies cough. States it is all in her head

## 2013-11-24 NOTE — Discharge Instructions (Signed)
1. Medications: afrin (twice per day for 3 days ONLY) then switch to flonase, mucinex, usual home medications 2. Treatment: rest, drink plenty of fluids, chloraseptic throat spray, tylenol and/or ibuprofen for headache, throat pain or low grade fever, salt water gargle for throat,  3. Follow Up: Please followup with your primary doctor for discussion of your diagnoses and further evaluation after today's visit; if you do not have a primary care doctor use the resource guide provided to find one;     Read the instructions below on reasons to return to the emergency department and to learn more about your diagnosis.  Use over the counter medications for symptomatic relief as we discussed (musinex as a decongestant, Tylenol for fever/pain, Motrin/Ibuprofen for muscle aches).  Followup with your primary care doctor in 4 days if your symptoms persist.  Your more than welcome to return to the emergency department if symptoms worsen or become concerning.  Upper Respiratory Infection, Adult  An upper respiratory infection (URI) is also sometimes known as the common cold. Most people improve within 1 week, but symptoms can last up to 2 weeks. A residual cough may last even longer.   URI is most commonly caused by a virus. Viruses are NOT treated with antibiotics. You can easily spread the virus to others by oral contact. This includes kissing, sharing a glass, coughing, or sneezing. Touching your mouth or nose and then touching a surface, which is then touched by another person, can also spread the virus.   TREATMENT  Treatment is directed at relieving symptoms. There is no cure. Antibiotics are not effective, because the infection is caused by a virus, not by bacteria. Treatment may include:  Increased fluid intake. Sports drinks offer valuable electrolytes, sugars, and fluids.  Breathing heated mist or steam (vaporizer or shower).  Eating chicken soup or other clear broths, and maintaining good nutrition.    Getting plenty of rest.  Using gargles or lozenges for comfort.  Controlling fevers with ibuprofen or acetaminophen as directed by your caregiver.  Increasing usage of your inhaler if you have asthma.  Return to work when your temperature has returned to normal.   SEEK MEDICAL CARE IF:  After the first few days, you feel you are getting worse rather than better.  You develop worsening shortness of breath, or brown or red sputum. These may be signs of pneumonia.  You develop yellow or brown nasal discharge or pain in the face, especially when you bend forward. These may be signs of sinusitis.  You develop a fever, swollen neck glands, pain with swallowing, or white areas in the back of your throat. These may be signs of strep throat.     Emergency Department Resource Guide 1) Find a Doctor and Pay Out of Pocket Although you won't have to find out who is covered by your insurance plan, it is a good idea to ask around and get recommendations. You will then need to call the office and see if the doctor you have chosen will accept you as a new patient and what types of options they offer for patients who are self-pay. Some doctors offer discounts or will set up payment plans for their patients who do not have insurance, but you will need to ask so you aren't surprised when you get to your appointment.  2) Contact Your Local Health Department Not all health departments have doctors that can see patients for sick visits, but many do, so it is worth a call to  see if yours does. If you don't know where your local health department is, you can check in your phone book. The CDC also has a tool to help you locate your state's health department, and many state websites also have listings of all of their local health departments.  3) Find a Walk-in Clinic If your illness is not likely to be very severe or complicated, you may want to try a walk in clinic. These are popping up all over the country in  pharmacies, drugstores, and shopping centers. They're usually staffed by nurse practitioners or physician assistants that have been trained to treat common illnesses and complaints. They're usually fairly quick and inexpensive. However, if you have serious medical issues or chronic medical problems, these are probably not your best option.  No Primary Care Doctor: - Call Health Connect at  [phone] - they can help you locate a primary care doctor that  accepts your insurance, provides certain services, etc. - Physician Referral Service- [phone]  Chronic Pain Problems: Organization         Address  Phone   Notes  [facility]  [phone]) [phone] Patients need to be referred by their primary care doctor.   Medication Assistance: Organization         Address  Phone   Notes  [facility] [address] [place], [place] [address] [phone]) [phone] --Must be a resident of [place] [facility] [place] -- Must have NO insurance coverage whatsoever (no Medicaid/ Medicare, etc.) -- The pt. MUST have a primary care doctor that directs their care regularly and follows them in the community   Med[facility]ist  [phone]   [employer]  [phone]    Agencies that provide inexpensive medical care: Organization         Address  Phone   Notes  [facility]  [phone]   [facility]    [phone]   [facility] [address] [place], [place] [address] [phone]   [facility] [address], [place] [phone](336) [phone]   Planned Parenthood    [phone]   [facility]    [phone]   [facility]  [address], [place] Phone:  [phone], Fax:  [phone] Hours of Operation:  9 am - 6 pm, M-F.  Also accepts Medicaid/Medicare and self-pay.  [facility]  [address],  [place] Phone: [phone]) [phone], Fax: [phone]. Hours of Operation:  8:30 am - 5:30 pm, M-F.  Also accepts Medicaid and self-pay.  [facility] [place] [address], [place] Phone: [phone]) [phone]   [facility] [address], [place], [place] [phone], Ext. [phone] Mondays & Thursdays: 7-9 AM.  First 15 patients are seen on a first come, first serve basis.    Medicaid-accepting [facility] Providers:  Organization         Address  Phone   Notes  [facility] [address], [place] [phone] Also accepts self-pay patients.  [facility] [address], [place]  [phone]) [phone]   [facility] [address], [place] [phone]) [phone]   [facility] [address] [place] [address], [place] [phone]) [phone]   Renaye RakersVeita Bland [address], [place]   [phone] Only accepts [place] Access Medicaid patients after they have their name applied to their card.   Self-Pay (no insurance) in [facility]:  Organization         Address  Phone   Notes  Sickle Cell Patients, [facility] [address] [place], [place] [phone]   [facility] [address] [place], [place] [phone]   [facility] [place]  [address], [place] [phone]   [facility]/Dr. Osei-Bonsu  [address], [place] or [address], [place] [phone] Phone number for both [place] and [place] locations is the same.  [facility] [address] [place] [address] [place] [phone]   [facility] [address] [place] or [address] [phone]   [facility] [address] [place] [phone], phone; [phone], fax Sees patients 1st and 3rd Saturday of every month.  Must not  qualify for public or private insurance (i.e. Medicaid, Medicare, [place] Health Choice, Veterans' Benefits)  Household income should be no more than 200% of the poverty level The clinic cannot treat you if you are pregnant or think you are pregnant  Sexually transmitted diseases are not treated at the clinic.    Dental Care: Organization         Address  Phone  Notes  [facility] [address] [place], [place] [phone] Accepts children up to age 31 who are enrolled in [place] or [place] Health Choice; pregnant women with a Medicaid card; and children who have applied for Medicaid or [place] Health Choice, but were declined, whose parents can pay a reduced fee at time of service.  [facility] of [facility]  [address], [place] [phone] Accepts children up to age 35 who are enrolled in [place] or [place] Health Choice; pregnant women with a Medicaid card; and children who have applied for Medicaid or  Health Choice, but were declined, whose parents can pay a reduced fee at time of service.  [facility]  [address] [place], [place] [phone] Patients are seen by appointment only. Walk-ins are not accepted. [facility] will see patients 24 years of age and older. Monday - Tuesday (8am-5pm) Most Wednesdays (8:30-5pm) $30 per visit, cash only  [facility] Access [facility]  [address], [facility] [phone] Patients are seen by appointment only. Walk-ins are not accepted. [facility] will see patients 34 years of age and older. One Wednesday Evening (Monthly: Volunteer Based).  $30 per visit, cash only  [employer]  [phone] for adults; Children under age 8, call Graduate Pediatric Dentistry at [phone]. Children aged 57-14, please call [phone] to request a pediatric application.  Dental services are provided  in all areas of dental care including fillings, crowns and bridges, complete and partial dentures, implants, gum treatment, root canals, and extractions. Preventive care is also provided. Treatment is provided to both adults and children. Patients are selected via a lottery and there is often a waiting list.   [facility] [address] [place]  [phone] [email or web address]   [facility] [address] [place], [place] [phone], Ext. [phone] Second and Fourth Thursday of each month, opens at 6:30  AM; Clinic ends at 9 AM.  Patients are seen on a first-come first-served basis, and a limited number are seen during each clinic.   [facility]  [address] Ether Griffins [place], [place] [phone]   Eligibility Requirements You must have lived in [place], [place], or [place] counties for at least the last three months.   You cannot be eligible for state or federal sponsored [place], including [employer], [place], or [employer].   You generally cannot be eligible for healthcare insurance through your employer.    How to apply: Eligibility screenings are held every Tuesday and Wednesday afternoon from 1:00 pm until 4:00 pm. You do not need an appointment for the interview!  [facility] [address] [place], [place] [phone]   [facility] Health Department  [phone]   [facility] Health Department  [phone]   [facility] Health Department  [phone]    Behavioral Health Resources in the Community: Intensive Outpatient Programs Organization         Address  Phone  Notes  [facility] [address] [place], [place] [phone]   [facility] [address], [place], [place] [phone]   [facility] [address], [place], [place]  [phone]   [facility] [address]  [place], [place]  [phone] or [phone]   Substance Abuse Resources Organization         Address  Phone  Notes  [facility] Drug Services  [phone]) [phone]   [facility]  [phone]   [facility]  [phone]   Floydene Flock  [phone]   Residential & Outpatient Substance Abuse Program  [phone]   Psychological Services Organization         Address  Phone  Notes  [facility] Behavioral Health  [phone]   [facility]  [phone]   [facility] Mental [facility] [address] [place] [phone] or [phone]    Mobile Crisis Teams Organization         Address  Phone  Notes  [facility], Mobile Crisis Care Unit  [phone]   [facility]  [address] [place], [place] [phone]   Doristine Locks [address] [place] [phone]    Self-Help/Support Groups Organization         Address  Phone             Notes  Mental Health Assoc. of  - variety of support groups  336- I7437963 Call for more information  Narcotics Anonymous (NA), [facility] [address], [employer] [place]  2 meetings at this location   Statistician         Address  Phone  Notes  [facility] [address],    [place]  [phone]   [facility]  [address] [place] [address], [place], [place] [phone]   [facility] [address] [place], [place] [phone] Admissions: 8am-3pm M-F  [facility] [address]    [place], [place] [phone]   [facility] [address] [place] Manns [place], [place] [phone]   [facility] [address]  [place], [place] [phone]   [facility] - Intensive Outpatient [address], [place], [place] [phone]   [facility] ([facility] [address] [place], [place] [phone] or  [phone]  [facility] ([facility]) [address], [place], [place] [phone] Accepts Medicaid  [facility] [address]  [place] [phone] Substance Abuse/Addiction Treatment   [facility] Organization         Address  Phone  Notes  [facility]  [phone]) [phone]   Angie Fava, PhD [address] Ervin Knack [place], [place]   214-782-5369 or [phone]   [facility]   [address] [place], [place] [phone]   [facility] [address] [place], [place] [phone] Insurance/Medicaid/sponsorship through [facility] and Families [address]                                    [place], [place] [phone] Therapy/tele-psych/case  [facility] [address], [place] [phone]    Dr. Lolly Mustache  [phone]   [facility] [place]  [employer] [facility] Dept. 1) [address] [place] 2) [address] [place] 3)  [address], [place] [phone]  [phone]   [facility] Child Abuse Hotline (201)100-4376 or 334-755-4604 (After Hours)

## 2013-12-01 NOTE — ED Provider Notes (Signed)
Medical screening examination/treatment/procedure(s) were performed by non-physician practitioner and as supervising physician I was immediately available for consultation/collaboration.   EKG Interpretation None        Melvie Paglia, MD 12/01/13 1051 

## 2014-07-24 ENCOUNTER — Encounter (HOSPITAL_COMMUNITY): Payer: Self-pay | Admitting: Emergency Medicine

## 2015-05-23 ENCOUNTER — Encounter (HOSPITAL_COMMUNITY): Payer: Self-pay | Admitting: Nurse Practitioner

## 2015-05-23 ENCOUNTER — Emergency Department (HOSPITAL_COMMUNITY)
Admission: EM | Admit: 2015-05-23 | Discharge: 2015-05-23 | Disposition: A | Discharge status: Home / Self Care | Payer: Medicaid Other | Attending: Emergency Medicine | Admitting: Emergency Medicine

## 2015-05-23 DIAGNOSIS — M79605 Pain in left leg: Secondary | ICD-10-CM | POA: Diagnosis present

## 2015-05-23 DIAGNOSIS — M5432 Sciatica, left side: Secondary | ICD-10-CM | POA: Diagnosis not present

## 2015-05-23 DIAGNOSIS — Z7951 Long term (current) use of inhaled steroids: Secondary | ICD-10-CM | POA: Insufficient documentation

## 2015-05-23 DIAGNOSIS — Z79899 Other long term (current) drug therapy: Secondary | ICD-10-CM | POA: Insufficient documentation

## 2015-05-23 DIAGNOSIS — J45909 Unspecified asthma, uncomplicated: Secondary | ICD-10-CM | POA: Diagnosis not present

## 2015-05-23 MED ORDER — HYDROCODONE-ACETAMINOPHEN 5-325 MG PO TABS: 1.0000 | ORAL_TABLET | ORAL | Status: DC | PRN

## 2015-05-23 MED ORDER — PREDNISONE 10 MG PO TABS: ORAL_TABLET | ORAL | Status: DC

## 2015-05-23 NOTE — ED Provider Notes (Signed)
CSN: [id number]     Arrival date & time 05/23/15  1854 History  This chart was scribed for Elpidio Anis, working with Mirian Mo, MD by Chestine Spore, ED Scribe. The patient was seen in room WTR9/WTR9 at 8:21 PM.     Chief Complaint  Patient presents with  . Leg Pain    Left leg pain     The history is provided by the patient. No language interpreter was used.    Leslie Decker is a 34 y.o. female with a medical hx of scoliosis, who presents to the Emergency Department complaining of left leg pain onset 2 weeks. Pt reports that her left leg pain began as an intermittent generalized pain and now her pain is constant and is a heavy sensation. Pt notes that her pain began as pain to the back of her left thigh. Pt denies any trauma or injury to the left leg. Pt rates her left leg pain as 7/10 and it is worsened with movement with nothing working to alleviate her symptoms. She denies color change, wound, rash, joint swelling, gait problem, back pain, bowel/bladder incontinence, and any other symptoms. Pt denies being a diabetic and is otherwise healthy. Pt denies being on hormones, being a smoker, or recent surgeries/procedures. Pt is currently trying to get a PCP at this time.  Past Medical History  Diagnosis Date  . Asthma    Past Surgical History  Procedure Laterality Date  . No past surgeries    . Tubal ligation  06/25/2011    Procedure: POST PARTUM TUBAL LIGATION;  Surgeon: Esmeralda Arthur, MD;  Location: [facility];  Service: Gynecology;  Laterality: Bilateral;   Family History  Problem Relation Age of Onset  . Hypertension Maternal Grandmother   . Diabetes Maternal Grandmother    Social History  Substance Use Topics  . Smoking status: Never Smoker   . Smokeless tobacco: None  . Alcohol Use: No   OB History    Gravida Para Term Preterm AB TAB SAB Ectopic Multiple Living   6 3 3  3 3    3      Review of Systems  Gastrointestinal:       No bowel incontinence  Genitourinary:        No bladder incontinence  Musculoskeletal: Positive for arthralgias. Negative for back pain, joint swelling and gait problem.  Skin: Negative for color change, rash and wound.      Allergies  Review of patient's allergies indicates no known allergies.  Home Medications   Prior to Admission medications   Medication Sig Start Date End Date Taking? Authorizing Provider  fluticasone (FLONASE) 50 MCG/ACT nasal spray Place 2 sprays into both nostrils daily. 11/24/13   Hannah Muthersbaugh, PA-C  guaiFENesin (MUCINEX) 600 MG 12 hr tablet Take 2 tablets (1,200 mg total) by mouth 2 (two) times daily. 11/24/13   Hannah Muthersbaugh, PA-C   BP 118/57 mmHg  Pulse 83  Temp(Src) 98.1 F (36.7 C) (Oral)  Resp 20  SpO2 100%  LMP 05/09/2015 Physical Exam  Constitutional: She is oriented to person, place, and time. She appears well-developed and well-nourished. No distress.  HENT:  Head: Normocephalic and atraumatic.  Eyes: EOM are normal.  Neck: Neck supple. No tracheal deviation present.  Cardiovascular: Normal rate and intact distal pulses.   Pulmonary/Chest: Effort normal. No respiratory distress.  Musculoskeletal: Normal range of motion.       Left lower leg: She exhibits no swelling.  No swelling of LLE. No focal TTP.  No redness or warmth distally. Tenderness to the left buttock that reproduces left leg pain consisted with sciatica.   Neurological: She is alert and oriented to person, place, and time.  Skin: Skin is warm and dry.  Psychiatric: She has a normal mood and affect. Her behavior is normal.  Nursing note and vitals reviewed.   ED Course  Procedures (including critical care time) DIAGNOSTIC STUDIES: Oxygen Saturation is 100% on RA, nl by my interpretation.    COORDINATION OF CARE: 8:22 PM Discussed treatment plan with pt at bedside and pt agreed to plan.    Labs Review Labs Reviewed - No data to display  Imaging Review No results found. Elpidio Anis, PA-C, have  personally reviewed and evaluated these images and lab results as part of my medical decision-making.    EKG Interpretation None      MDM   Final diagnoses:  None    1. Left sciatica  She has reproducible tenderness over left sciatic nerve. No risk factors or objective findings to cause concern for DVT. Will start on prednisone taper, pain control and refer to ortho for follow up prn.   I personally performed the services described in this documentation, which was scribed in my presence. The recorded information has been reviewed and is accurate.     Elpidio Anis, PA-C 05/23/15 2251  Mirian Mo, MD 05/25/15 [phone]

## 2015-05-23 NOTE — Discharge Instructions (Signed)
Sciatica Sciatica is pain, weakness, numbness, or tingling along the path of the sciatic nerve. The nerve starts in the lower back and runs down the back of each leg. The nerve controls the muscles in the lower leg and in the back of the knee, while also providing sensation to the back of the thigh, lower leg, and the sole of your foot. Sciatica is a symptom of another medical condition. For instance, nerve damage or certain conditions, such as a herniated disk or bone spur on the spine, pinch or put pressure on the sciatic nerve. This causes the pain, weakness, or other sensations normally associated with sciatica. Generally, sciatica only affects one side of the body. CAUSES   Herniated or slipped disc.  Degenerative disk disease.  A pain disorder involving the narrow muscle in the buttocks (piriformis syndrome).  Pelvic injury or fracture.  Pregnancy.  Tumor (rare). SYMPTOMS  Symptoms can vary from mild to very severe. The symptoms usually travel from the low back to the buttocks and down the back of the leg. Symptoms can include:  Mild tingling or dull aches in the lower back, leg, or hip.  Numbness in the back of the calf or sole of the foot.  Burning sensations in the lower back, leg, or hip.  Sharp pains in the lower back, leg, or hip.  Leg weakness.  Severe back pain inhibiting movement. These symptoms may get worse with coughing, sneezing, laughing, or prolonged sitting or standing. Also, being overweight may worsen symptoms. DIAGNOSIS  Your caregiver will perform a physical exam to look for common symptoms of sciatica. He or she may ask you to do certain movements or activities that would trigger sciatic nerve pain. Other tests may be performed to find the cause of the sciatica. These may include:  Blood tests.  X-rays.  Imaging tests, such as an MRI or CT scan. TREATMENT  Treatment is directed at the cause of the sciatic pain. Sometimes, treatment is not necessary  and the pain and discomfort goes away on its own. If treatment is needed, your caregiver may suggest:  Over-the-counter medicines to relieve pain.  Prescription medicines, such as anti-inflammatory medicine, muscle relaxants, or narcotics.  Applying heat or ice to the painful area.  Steroid injections to lessen pain, irritation, and inflammation around the nerve.  Reducing activity during periods of pain.  Exercising and stretching to strengthen your abdomen and improve flexibility of your spine. Your caregiver may suggest losing weight if the extra weight makes the back pain worse.  Physical therapy.  Surgery to eliminate what is pressing or pinching the nerve, such as a bone spur or part of a herniated disk. HOME CARE INSTRUCTIONS   Only take over-the-counter or prescription medicines for pain or discomfort as directed by your caregiver.  Apply ice to the affected area for 20 minutes, 3-4 times a day for the first 48-72 hours. Then try heat in the same way.  Exercise, stretch, or perform your usual activities if these do not aggravate your pain.  Attend physical therapy sessions as directed by your caregiver.  Keep all follow-up appointments as directed by your caregiver.  Do not wear high heels or shoes that do not provide proper support.  Check your mattress to see if it is too soft. A firm mattress may lessen your pain and discomfort. SEEK IMMEDIATE MEDICAL CARE IF:   You lose control of your bowel or bladder (incontinence).  You have increasing weakness in the lower back, pelvis, buttocks,   or legs.  You have redness or swelling of your back.  You have a burning sensation when you urinate.  You have pain that gets worse when you lie down or awakens you at night.  Your pain is worse than you have experienced in the past.  Your pain is lasting longer than 4 weeks.  You are suddenly losing weight without reason. MAKE SURE YOU:  Understand these  instructions.  Will watch your condition.  Will get help right away if you are not doing well or get worse. Document Released: 09/02/2001 Document Revised: 03/09/2012 Document Reviewed: 01/18/2012 Exit[employer] Patient Information 2015 [employer]. This information is not intended to replace advice given to you by your health care provider. Make sure you discuss any questions you have with your health care provider.  

## 2015-05-23 NOTE — ED Notes (Addendum)
Pt is c/o left leg pain 7/10, alleviated by nothing, worsened by activity. States this has been ongoing for 2 weeks, reports it was swollen at some point but denies swelling, warmness to touch or discoloring to the affected leg. Denies hx of DVT's, or risk factors.

## 2015-11-12 ENCOUNTER — Ambulatory Visit: Payer: Medicaid Other | Attending: Orthopaedic Surgery | Admitting: Physical Therapy

## 2015-11-12 DIAGNOSIS — M25552 Pain in left hip: Secondary | ICD-10-CM | POA: Insufficient documentation

## 2015-11-12 DIAGNOSIS — M5442 Lumbago with sciatica, left side: Secondary | ICD-10-CM | POA: Insufficient documentation

## 2015-11-19 ENCOUNTER — Encounter: Payer: Self-pay | Admitting: Physical Therapy

## 2015-11-19 ENCOUNTER — Ambulatory Visit: Payer: Medicaid Other | Admitting: Physical Therapy

## 2015-11-19 DIAGNOSIS — M5442 Lumbago with sciatica, left side: Secondary | ICD-10-CM

## 2015-11-19 DIAGNOSIS — M25552 Pain in left hip: Secondary | ICD-10-CM | POA: Diagnosis present

## 2015-11-19 NOTE — Patient Instructions (Signed)
Knee-to-Chest: with Neck Flexion Stretch (Supine)   Pull left knee to chest, tucking chin and lifting head. Hold __10__ seconds. Relax. Repeat _10___ times per set. Do _2___ sets per session. Do __2__ sessions per day.  Trunk: Knees to Chest   Lie on firm, flat surface. Keep head and shoulders flat on surface. Tuck hands behind knees and pull to chest. Hold _10___ seconds. Repeat __10__ times. Do _2___ sessions per day. CAUTION: Movement should be gentle and slow.  Caudal Rotation: Hip Roll, Neutral Lordosis - Supine   Lie with knees bent and slightly elevated, feet flat. Tighten stomach, lower knees out to right side, rotating hips and trunk. Keep stomach tight for return. Repeat _10___ times per set. Do __2__ sets per session. Do _2___ sessions per week.  Pelvic Tilt: Anterior - Legs Bent (Supine)   Rotate pelvis up and arch back. Hold ____ seconds. Relax. Repeat ____ times per set. Do ____ sets per session. Do ____ sessions per day.  Piriformis Stretch   Lying on back, pull right knee toward opposite shoulder. Hold __30__ seconds. Repeat _4___ times. Do _2___ sessions per day.   Also ITB, HS and piriformis mm stretches

## 2015-11-19 NOTE — Therapy (Signed)
[facility] [address] [place], [place], [address] Phone: [phone]   Fax:  [phone]  Physical Therapy Evaluation  Patient Details  Name: Desa Rech MRN: [id number] Date of Birth: 10/03/1980 Referring Provider: Yisroel Ramming  Encounter Date: 11/19/2015      PT End of Session - 11/19/15 1531    Visit Number 1   Authorization Type Medicaid   PT Start Time 1507   PT Stop Time 1602   PT Time Calculation (min) 55 min   Activity Tolerance Patient tolerated treatment well   Behavior During Therapy [facility] for tasks assessed/performed      History reviewed. No pertinent past medical history.  History reviewed. No pertinent past surgical history.  There were no vitals filed for this visit.  Visit Diagnosis:  Left-sided low back pain with left-sided sciatica - Plan: PT plan of care cert/re-cert  Left hip pain - Plan: PT plan of care cert/re-cert      Subjective Assessment - 11/19/15 1510    Subjective Patient iwth left buttock pain since August, she is unsure of a cause but reports that she did fall around that time.  X-rays negative.  Had some pain meds with good results but without the pain meds pain is back   Limitations Sitting;Lifting;Standing;House hold activities   Patient Stated Goals have less pain   Currently in Pain? Yes   Pain Score 6    Pain Location Hip   Pain Orientation Left   Pain Descriptors / Indicators Aching   Pain Onset More than a month ago   Pain Frequency Constant   Aggravating Factors  being in one position pain can be up to 9/10   Pain Relieving Factors rest pain can be down to a 3/10 with Tylenol   Effect of Pain on Daily Activities limts ADL's            [facility] PT Assessment - 11/19/15 0001    Assessment   Medical Diagnosis low back and left hip pain   Referring Provider daldorf   Onset Date/Surgical Date 10/19/15   Prior Therapy no   Precautions   Precautions None   Balance Screen   Has the patient fallen in the past 6 months No   Has the patient had a decrease in activity level because of a fear of falling?  No   Is the patient reluctant to leave their home because of a fear of falling?  No   Home Environment   Additional Comments housework, has a small child that she lifts   Prior Function   Level of Independence Independent   Leisure no exercise   Posture/Postural Control   Posture Comments fwd head, rounded shoulders, tends to lean away from the left side (painful side)   AROM   Overall AROM Comments lumbar WNL's, left hip flexion in standing is only 30 degrees with left lateral hip pain, abduction 20, extension 10 degrees   Strength   Overall Strength Comments left hip 4-/5, right 4+/5, mild left hip pain   Flexibility   Soft Tissue Assessment /Muscle Length --  very tight ITB, some tightnes sof the HS and piriformis   Palpation   Palpation comment pain is mostly lateral above the greater trochanter, she is tender in the left buttock and SI area.   Ambulation/Gait   Gait Comments antalgic gait on the left  [facility] Adult PT Treatment/Exercise - 11/19/15 0001    Exercises   Exercises Knee/Hip   Knee/Hip Exercises: Stretches   Active Hamstring Stretch 2 reps;10 seconds   ITB Stretch 2 reps;10 seconds   Piriformis Stretch 2 reps;10 seconds   Knee/Hip Exercises: Aerobic   Nustep Level 4 x 6 minutes                     PT Long Term Goals - 11/19/15 1533    PT LONG TERM GOAL #1   Title independent with HEP   Time 1   Period Days   Status Achieved               Plan - 11/19/15 1532    Clinical Impression Statement Patient with c/o mostly left lateral hip pain, has limited active flexion, very tight ITB, she is very tender along the ITB.  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  Patient to do HEP   Consulted and Agree with Plan of Care Patient         Problem List There are no active problems to display for this patient.   Jearld Lesch., PT 11/19/2015, 4:01 PM  [facility] [address] [place], [place], [address] Phone: [phone]   Fax:  [phone]  Name: Mimie Goering MRN: [id number] Date of Birth: 04-Jan-1981

## 2017-02-23 ENCOUNTER — Encounter (HOSPITAL_COMMUNITY): Payer: Self-pay | Admitting: *Deleted

## 2017-02-23 ENCOUNTER — Emergency Department (HOSPITAL_COMMUNITY)
Admission: EM | Admit: 2017-02-23 | Discharge: 2017-02-23 | Disposition: A | Discharge status: Home / Self Care | Payer: Medicaid Other | Attending: Emergency Medicine | Admitting: Emergency Medicine

## 2017-02-23 DIAGNOSIS — Z79899 Other long term (current) drug therapy: Secondary | ICD-10-CM | POA: Diagnosis not present

## 2017-02-23 DIAGNOSIS — J45909 Unspecified asthma, uncomplicated: Secondary | ICD-10-CM | POA: Diagnosis not present

## 2017-02-23 DIAGNOSIS — J029 Acute pharyngitis, unspecified: Secondary | ICD-10-CM

## 2017-02-23 LAB — RAPID STREP SCREEN (MED CTR MEBANE ONLY): STREPTOCOCCUS, GROUP A SCREEN (DIRECT): NEGATIVE

## 2017-02-23 MED ORDER — HYDROCODONE-ACETAMINOPHEN 7.5-325 MG/15ML PO SOLN: 15.0000 mL | Freq: Three times a day (TID) | ORAL | 0 refills | Status: DC | PRN

## 2017-02-23 MED ORDER — IBUPROFEN 200 MG PO TABS
600.0000 mg | ORAL_TABLET | Freq: Once | ORAL | Status: AC
  Administered 2017-02-23: 600 mg via ORAL
  Filled 2017-02-23: qty 3

## 2017-02-23 NOTE — ED Provider Notes (Signed)
[facility]-EMERGENCY DEPT Provider Note   CSN: [id number] Arrival date & time: 02/23/17  1742     History   Chief Complaint Chief Complaint  Patient presents with  . Sore Throat  . Headache  . Cough    HPI Leslie Decker is a 36 y.o. female.  HPI   Patient is a 36 year old female with no pertinent past medical history presents the ED with complaint of gradually worsening sore throat. Patient reports over the past 2 days she has had worsening sore throat which she states is worse with swallowing. She notes she has continued to drink lots of water at home but has had difficulty eating food. Endorses associated popping of both of her ears and nasal congestion. Denies taking any medications at home for her symptoms. Denies fever, facial/neck swelling, difficulty breathing, drooling, trismus, voice change, shortness of breath, vomiting. Denies any known sick contacts.  Past Medical History:  Diagnosis Date  . Asthma     Patient Active Problem List   Diagnosis Date Noted  . Obesity, morbid (HCC) 06/24/2011  . GERD (gastroesophageal reflux disease) 06/24/2011  . Asthma 06/24/2011    Past Surgical History:  Procedure Laterality Date  . NO PAST SURGERIES    . TUBAL LIGATION  06/25/2011   Procedure: POST PARTUM TUBAL LIGATION;  Surgeon: Esmeralda ArthurSandra A Rivard, MD;  Location: [facility];  Service: Gynecology;  Laterality: Bilateral;    OB History    Gravida Para Term Preterm AB Living   6 3 3   3 3    SAB TAB Ectopic Multiple Live Births     3     1       Home Medications    Prior to Admission medications   Medication Sig Start Date End Date Taking? Authorizing Provider  fluticasone (FLONASE) 50 MCG/ACT nasal spray Place 2 sprays into both nostrils daily. 11/24/13   Muthersbaugh, Dahlia ClientHannah, PA-C  guaiFENesin (MUCINEX) 600 MG 12 hr tablet Take 2 tablets (1,200 mg total) by mouth 2 (two) times daily. 11/24/13   Muthersbaugh, Dahlia ClientHannah, PA-C  HYDROcodone-acetaminophen (HYCET) 7.5-325 mg/15 ml solution  Take 15 mLs by mouth every 8 (eight) hours as needed for moderate pain. 02/23/17   Barrett HenleNadeau, Julya Alioto Elizabeth, PA-C  predniSONE (DELTASONE) 10 MG tablet Take 6 on day 1 Take 5 on day 2 Take 4 on day 3 Take 3 on day 4 Take 2 on day 5 Take 1 on day 6 05/23/15   Elpidio AnisUpstill, Shari, PA-C    Family History Family History  Problem Relation Age of Onset  . Hypertension Maternal Grandmother   . Diabetes Maternal Grandmother     Social History Social History  Substance Use Topics  . Smoking status: Never Smoker  . Smokeless tobacco: Not on file  . Alcohol use No     Allergies   Patient has no known allergies.   Review of Systems Review of Systems  HENT: Positive for congestion and sore throat.   All other systems reviewed and are negative.    Physical Exam Updated Vital Signs BP 135/89 (BP Location: Left Arm)   Pulse 96   Temp 99.5 F (37.5 C) (Oral)   Resp 16   SpO2 97%   Physical Exam  Constitutional: She is oriented to person, place, and time. She appears well-developed and well-nourished. No distress.  HENT:  Head: Normocephalic and atraumatic.  Right Ear: Tympanic membrane normal.  Left Ear: Tympanic membrane normal.  Nose: Rhinorrhea present. Right sinus exhibits no maxillary sinus tenderness and  no frontal sinus tenderness. Left sinus exhibits no maxillary sinus tenderness and no frontal sinus tenderness.  Mouth/Throat: Uvula is midline and mucous membranes are normal. No oral lesions. No trismus in the jaw. No dental abscesses or uvula swelling. Oropharyngeal exudate and posterior oropharyngeal erythema present. No posterior oropharyngeal edema or tonsillar abscesses. Tonsils are 1+ on the right. Tonsils are 1+ on the left. No tonsillar exudate.  Mild erythema and white exudate present to posterior oral pharynx and bilateral tonsils. No trismus, drooling, facial/neck swelling or stridor on exam. No muffled voice. Floor of mouth soft.  No facial or neck swelling.  Eyes:  Conjunctivae and EOM are normal. Right eye exhibits no discharge. Left eye exhibits no discharge. No scleral icterus.  Neck: Normal range of motion. Neck supple.  Cardiovascular: Normal rate, regular rhythm, normal heart sounds and intact distal pulses.   Pulmonary/Chest: Effort normal and breath sounds normal. No respiratory distress. She has no wheezes. She has no rales. She exhibits no tenderness.  Abdominal: Soft. Bowel sounds are normal. There is no tenderness.  Musculoskeletal: She exhibits no edema.  Lymphadenopathy:    She has cervical adenopathy (bilateral submandibular).  Neurological: She is alert and oriented to person, place, and time.  Skin: Skin is warm and dry. She is not diaphoretic.  Nursing note and vitals reviewed.    ED Treatments / Results  Labs (all labs ordered are listed, but only abnormal results are displayed) Labs Reviewed  RAPID STREP SCREEN (NOT AT [facility])  CULTURE, GROUP A STREP [facility])    EKG  EKG Interpretation None       Radiology No results found.  Procedures Procedures (including critical care time)  Medications Ordered in ED Medications  ibuprofen (ADVIL,MOTRIN) tablet 600 mg (600 mg Oral Given 02/23/17 1932)     Initial Impression / Assessment and Plan / ED Course  I have reviewed the triage vital signs and the nursing notes.  Pertinent labs & imaging results that were available during my care of the patient were reviewed by me and considered in my medical decision making (see chart for details).     Pt afebrile with tonsillar exudate, cervical lymphadenopathy, & dysphagia. Negative strep. Treated in the Ed with NSAIDs.  Pt appears well hydrated, discussed importance of continued water hydration. Presentation non concerning for PTA or infxn spread to soft tissue. No trismus or uvula deviation. Specific return precautions discussed. Pt able to drink water in ED without difficulty with intact air way. Recommended PCP follow up.    Final  Clinical Impressions(s) / ED Diagnoses   Final diagnoses:  Pharyngitis, unspecified etiology    New Prescriptions New Prescriptions   HYDROCODONE-ACETAMINOPHEN (HYCET) 7.5-325 MG/15 ML SOLUTION    Take 15 mLs by mouth every 8 (eight) hours as needed for moderate pain.     Barrett Henle, PA-C 02/23/17 2036    Lorre Nick, MD 02/23/17 2340

## 2017-02-23 NOTE — Discharge Instructions (Signed)
Take your medication as prescribed as needed for pain relief. I also recommend taking 600 mg ibuprofen every 6 hours as needed for additional pain relief. Continue drinking fluids at home to remain hydrated. Follow-up with your primary care provider within the next 3-4 days if your symptoms have not improved. Please return to the Emergency Department if symptoms worsen or new onset of fever, facial/neck swelling, drooling due to being unable to swallow, unable to open your mouth fully, difficulty breathing, unable to keep fluids down.

## 2017-02-23 NOTE — ED Triage Notes (Signed)
Pt complaining of throat pain, hard to swallow, headache since yesterday. Productive cough

## 2017-02-26 LAB — CULTURE, GROUP A STREP (THRC)

## 2017-03-22 ENCOUNTER — Emergency Department (HOSPITAL_COMMUNITY)
Admission: EM | Admit: 2017-03-22 | Discharge: 2017-03-23 | Disposition: A | Discharge status: Other Acute Inpatient Hospital | Payer: Medicaid Other | Attending: Emergency Medicine | Admitting: Emergency Medicine

## 2017-03-22 ENCOUNTER — Encounter (HOSPITAL_COMMUNITY): Payer: Self-pay | Admitting: Nurse Practitioner

## 2017-03-22 DIAGNOSIS — F329 Major depressive disorder, single episode, unspecified: Secondary | ICD-10-CM | POA: Insufficient documentation

## 2017-03-22 DIAGNOSIS — R45851 Suicidal ideations: Secondary | ICD-10-CM | POA: Insufficient documentation

## 2017-03-22 DIAGNOSIS — J45909 Unspecified asthma, uncomplicated: Secondary | ICD-10-CM | POA: Insufficient documentation

## 2017-03-22 DIAGNOSIS — F332 Major depressive disorder, recurrent severe without psychotic features: Secondary | ICD-10-CM | POA: Diagnosis present

## 2017-03-22 LAB — COMPREHENSIVE METABOLIC PANEL
ALK PHOS: 44 U/L (ref 38–126)
ALT: 14 U/L (ref 14–54)
AST: 13 U/L — AB (ref 15–41)
Albumin: 3.9 g/dL (ref 3.5–5.0)
Anion gap: 9 (ref 5–15)
BILIRUBIN TOTAL: 0.3 mg/dL (ref 0.3–1.2)
BUN: 16 mg/dL (ref 6–20)
CALCIUM: 9 mg/dL (ref 8.9–10.3)
CO2: 19 mmol/L — ABNORMAL LOW (ref 22–32)
CREATININE: 0.71 mg/dL (ref 0.44–1.00)
Chloride: 108 mmol/L (ref 101–111)
Glucose, Bld: 104 mg/dL — ABNORMAL HIGH (ref 65–99)
Potassium: 3.7 mmol/L (ref 3.5–5.1)
Sodium: 136 mmol/L (ref 135–145)
Total Protein: 7.4 g/dL (ref 6.5–8.1)

## 2017-03-22 LAB — CBC
HEMATOCRIT: 36.1 % (ref 36.0–46.0)
HEMOGLOBIN: 12.4 g/dL (ref 12.0–15.0)
MCH: 26.3 pg (ref 26.0–34.0)
MCHC: 34.3 g/dL (ref 30.0–36.0)
MCV: 76.5 fL — AB (ref 78.0–100.0)
Platelets: 266 10*3/uL (ref 150–400)
RBC: 4.72 MIL/uL (ref 3.87–5.11)
RDW: 13.2 % (ref 11.5–15.5)
WBC: 6.9 10*3/uL (ref 4.0–10.5)

## 2017-03-22 LAB — SALICYLATE LEVEL: Salicylate Lvl: <7 mg/dL (ref 2.8–30.0)

## 2017-03-22 LAB — RAPID URINE DRUG SCREEN, HOSP PERFORMED
Amphetamines: NOT DETECTED
BARBITURATES: NOT DETECTED
BENZODIAZEPINES: NOT DETECTED
Cocaine: NOT DETECTED
Opiates: NOT DETECTED
Tetrahydrocannabinol: NOT DETECTED

## 2017-03-22 LAB — POC URINE PREG, ED: PREG TEST UR: NEGATIVE

## 2017-03-22 LAB — ACETAMINOPHEN LEVEL: Acetaminophen (Tylenol), Serum: <10 ug/mL — ABNORMAL LOW (ref 10–30)

## 2017-03-22 LAB — ETHANOL

## 2017-03-22 MED ORDER — IBUPROFEN 200 MG PO TABS
400.0000 mg | ORAL_TABLET | Freq: Once | ORAL | Status: AC
  Administered 2017-03-22: 400 mg via ORAL
  Filled 2017-03-22: qty 2

## 2017-03-22 NOTE — ED Provider Notes (Signed)
[facility]Y DEPT Provider Note   CSN: [id number] Arrival date & time: 03/22/17  1956     History   Chief Complaint Chief Complaint  Patient presents with  . Suicide Attempt    HPI Flora Parks is a 36 y.o. female. Chief complaint is depression, suicidal thoughts.  HPI:  36 year old female. History of depression. Not currently medicated. Has been "speaking with" her primary care provider over the last few weeks. However, no specific treatment has been initiated. States that she's been "having trouble getting my mind under control". States she is "making really bad decisions". She is crying and distraught upon arrival here. She states that she was considering stepping in front of the vehicle today for a suicide attempt.  Past Medical History:  Diagnosis Date  . Asthma     Patient Active Problem List   Diagnosis Date Noted  . Obesity, morbid (HCC) 06/24/2011  . GERD (gastroesophageal reflux disease) 06/24/2011  . Asthma 06/24/2011    Past Surgical History:  Procedure Laterality Date  . NO PAST SURGERIES    . TUBAL LIGATION  06/25/2011   Procedure: POST PARTUM TUBAL LIGATION;  Surgeon: Esmeralda Arthur, MD;  Location: [facility];  Service: Gynecology;  Laterality: Bilateral;    OB History    Gravida Para Term Preterm AB Living   6 3 3   3 3    SAB TAB Ectopic Multiple Live Births     3     1       Home Medications    Prior to Admission medications   Medication Sig Start Date End Date Taking? Authorizing Provider  fluticasone (FLONASE) 50 MCG/ACT nasal spray Place 2 sprays into both nostrils daily. Patient not taking: Reported on 03/22/2017 11/24/13   Muthersbaugh, Dahlia Client, PA-C  guaiFENesin (MUCINEX) 600 MG 12 hr tablet Take 2 tablets (1,200 mg total) by mouth 2 (two) times daily. Patient not taking: Reported on 03/22/2017 11/24/13   Muthersbaugh, Dahlia Client, PA-C  HYDROcodone-acetaminophen (HYCET) 7.5-325 mg/15 ml solution Take 15 mLs by mouth every 8 (eight) hours as needed for  moderate pain. Patient not taking: Reported on 03/22/2017 02/23/17   Barrett Henle, PA-C  predniSONE (DELTASONE) 10 MG tablet Take 6 on day 1 Take 5 on day 2 Take 4 on day 3 Take 3 on day 4 Take 2 on day 5 Take 1 on day 6 Patient not taking: Reported on 03/22/2017 05/23/15   Elpidio Anis, PA-C    Family History Family History  Problem Relation Age of Onset  . Hypertension Maternal Grandmother   . Diabetes Maternal Grandmother     Social History Social History  Substance Use Topics  . Smoking status: Never Smoker  . Smokeless tobacco: Not on file  . Alcohol use No     Allergies   Patient has no known allergies.   Review of Systems Review of Systems  Constitutional: Negative for appetite change, chills, diaphoresis, fatigue and fever.  HENT: Negative for mouth sores, sore throat and trouble swallowing.   Eyes: Negative for visual disturbance.  Respiratory: Negative for cough, chest tightness, shortness of breath and wheezing.   Cardiovascular: Negative for chest pain.  Gastrointestinal: Negative for abdominal distention, abdominal pain, diarrhea, nausea and vomiting.  Endocrine: Negative for polydipsia, polyphagia and polyuria.  Genitourinary: Negative for dysuria, frequency and hematuria.  Musculoskeletal: Negative for gait problem.  Skin: Negative for color change, pallor and rash.  Neurological: Negative for dizziness, syncope, light-headedness and headaches.  Hematological: Does not bruise/bleed easily.  Psychiatric/Behavioral: Positive for dysphoric mood, sleep disturbance and suicidal ideas. Negative for behavioral problems and confusion. The patient is nervous/anxious.      Physical Exam Updated Vital Signs BP (!) 154/102 (BP Location: Right Arm)   Pulse 82   Temp 99.4 F (37.4 C) (Oral)   Resp 20   SpO2 100%   Physical Exam  Constitutional: She is oriented to person, place, and time. She appears well-developed and well-nourished. No distress.    HENT:  Head: Normocephalic.  Eyes: Conjunctivae are normal. Pupils are equal, round, and reactive to light. No scleral icterus.  Neck: Normal range of motion. Neck supple. No thyromegaly present.  Cardiovascular: Normal rate and regular rhythm.  Exam reveals no gallop and no friction rub.   No murmur heard. Pulmonary/Chest: Effort normal and breath sounds normal. No respiratory distress. She has no wheezes. She has no rales.  Abdominal: Soft. Bowel sounds are normal. She exhibits no distension. There is no tenderness. There is no rebound.  Musculoskeletal: Normal range of motion.  Neurological: She is alert and oriented to person, place, and time.  Skin: Skin is warm and dry. No rash noted.  Psychiatric: Her mood appears anxious. Her speech is rapid and/or pressured.  Crying, pressured speech.     ED Treatments / Results  Labs (all labs ordered are listed, but only abnormal results are displayed) Labs Reviewed  COMPREHENSIVE METABOLIC PANEL - Abnormal; Notable for the following:       Result Value   CO2 19 (*)    Glucose, Bld 104 (*)    AST 13 (*)    All other components within normal limits  CBC - Abnormal; Notable for the following:    MCV 76.5 (*)    All other components within normal limits  RAPID URINE DRUG SCREEN, HOSP PERFORMED  ETHANOL  SALICYLATE LEVEL  ACETAMINOPHEN LEVEL  POC URINE PREG, ED    EKG  EKG Interpretation None       Radiology No results found.  Procedures Procedures (including critical care time)  Medications Ordered in ED Medications - No data to display   Initial Impression / Assessment and Plan / ED Course  I have reviewed the triage vital signs and the nursing notes.  Pertinent labs & imaging results that were available during my care of the patient were reviewed by me and considered in my medical decision making (see chart for details).    Pt attempting to leave, but still staing " don't know" when asked about SI or intent.  Placed under IVC by me. Await medicaleval and TTS.  Final Clinical Impressions(s) / ED Diagnoses   Final diagnoses:  Suicidal thoughts    New Prescriptions New Prescriptions   No medications on file     Rolland PorterJames, Anjolina Byrer, MD 03/22/17 2250

## 2017-03-22 NOTE — ED Notes (Signed)
TTS equipment setup for assessment team.

## 2017-03-22 NOTE — ED Notes (Signed)
Nurse told me to hold off on collecting the labs until the MD see the patient

## 2017-03-22 NOTE — BH Assessment (Addendum)
Tele Assessment Note   Leslie Decker is an 36 y.o. single female who presents unaccompanied to [name] ED reporting symptoms of depression including suicidal ideation. Pt reports she has experienced depressive symptoms for at least five years and over the past year the symptoms have worsened. She states that today she "wasn't making decisions the way I normally do." She says about three hours prior to arriving to the ED she stood in front of traffic contemplating committing suicide. She says today she "feels hatred toward myself." Pt reports symptoms including crying spells, social withdrawal, loss of interest in usual pleasures, fatigue, irritability, decreased concentration, increased sleep and feelings of guilt and hopelessness. Pt says she has increased anxiety in social situations. She says she has experienced panic attacks in the past but denies any recent panic attacks. She denies history of suicide attempts or intentional self-injurious behavior. She denies current homicidal ideation or history of violence. She denies any history of psychotic symptoms. She denies any history of alcohol or substance use; Pt's blood alcohol level and urine drug screen are negative.  Pt cannot identify any specific stressors. She states she believes her depressive episodes are related to her menstrual cycle and that her mood deteriorates a few days before and after her period. Pt says she has consulted with her primary care physician, Leslie Decker at [facility]. Pt lives with her significant other of sixteen years and there three children, ages 23, 79 and 36. Pt describes her relationship with her significant other as good and says her children are well. She states she has a large supportive family. Pt denies any known family history of mental health problems and states her father had a history of using heroin but she didn't have a close relationship with him. Pt denies any history of abuse or trauma.  Pt denies any current legal problems.   Pt reports in 2014 she received brief outpatient therapy for depression through [facility]. She reports she was prescribed an antidepressant but never filled the prescription because she is reluctant to take medication. Pt denies any history of inpatient psychiatric treatment.  Pt is dressed in hospital scrubs, alert, oriented x4 with normal speech and normal motor behavior. Eye contact is good and Pt is tearful. Pt's mood is depressed and affect is congruent with mood. Thought process is coherent and relevant. There is no indication Pt is currently responding to internal stimuli or experiencing delusional thought content. Pt was unable to contract for safety with EDP and he petitioned for involuntary commitment.   Diagnosis: Major Depressive Disorder, Recurrent, Severe Without Psychotic Features.  Past Medical History:  Past Medical History:  Diagnosis Date  . Asthma     Past Surgical History:  Procedure Laterality Date  . NO PAST SURGERIES    . TUBAL LIGATION  06/25/2011   Procedure: POST PARTUM TUBAL LIGATION;  Surgeon: Esmeralda Arthur, MD;  Location: [facility];  Service: Gynecology;  Laterality: Bilateral;    Family History:  Family History  Problem Relation Age of Onset  . Hypertension Maternal Grandmother   . Diabetes Maternal Grandmother     Social History:  reports that she has never smoked. She does not have any smokeless tobacco history on file. She reports that she does not drink alcohol or use drugs.  Additional Social History:  Alcohol / Drug Use Pain Medications: None Prescriptions: See MAR Over the Counter: See MAR History of alcohol / drug use?: No history of alcohol / drug abuse Longest period  of sobriety (when/how long): NA  CIWA: CIWA-Ar BP: (!) 154/102 Pulse Rate: 82 COWS:    PATIENT STRENGTHS: (choose at least two) Ability for insight Average or above average intelligence Capable of independent living Investment banker, operational fund of knowledge Motivation for treatment/growth Physical Health Supportive family/friends  Allergies: No Known Allergies  Home Medications:  (Not in a hospital admission)  OB/GYN Status:  No LMP recorded.  General Assessment Data Location of Assessment: [facility] ED TTS Assessment: In system Is this a Tele or Face-to-Face Assessment?: Tele Assessment Is this an Initial Assessment or a Re-assessment for this encounter?: Initial Assessment Marital status: Single Maiden name: Cauthon Is patient pregnant?: No Pregnancy Status: No Living Arrangements: Spouse/significant other, Children (Significant other, three children (13, 10, 5)) Can pt return to current living arrangement?: Yes Admission Status: Involuntary Is patient capable of signing voluntary admission?: Yes Referral Source: Self/Family/Friend Insurance type: Medicaid     Crisis Care Plan Living Arrangements: Spouse/significant other, Children (Significant other, three children (13, 10, 5)) Legal Guardian: Other: (Self) Name of Psychiatrist: None Name of Therapist: None  Education Status Is patient currently in school?: No Current Grade: NA Highest grade of school patient has completed: Bachelor's degree Name of school: NA Contact person: NA  Risk to self with the past 6 months Suicidal Ideation: Yes-Currently Present Has patient been a risk to self within the past 6 months prior to admission? : Yes Suicidal Intent: Yes-Currently Present Has patient had any suicidal intent within the past 6 months prior to admission? : Yes Is patient at risk for suicide?: Yes Suicidal Plan?: Yes-Currently Present Has patient had any suicidal plan within the past 6 months prior to admission? : Yes Specify Current Suicidal Plan: Pt reports thoughts of walking into traffic today Access to Means: Yes Specify Access to Suicidal Means: Pt has access to traffic What has been your use of drugs/alcohol within  the last 12 months?: Pt denies Previous Attempts/Gestures: No How many times?: 0 Other Self Harm Risks: None Triggers for Past Attempts: None known Intentional Self Injurious Behavior: None Family Suicide History: Yes (Close friend committed suicide nine years ago) Recent stressful life event(s): Other (Comment) (Pt cannot identify stressors) Persecutory voices/beliefs?: No Depression: Yes Depression Symptoms: Despondent, Tearfulness, Isolating, Fatigue, Guilt, Loss of interest in usual pleasures, Feeling worthless/self pity, Feeling angry/irritable Substance abuse history and/or treatment for substance abuse?: No Suicide prevention information given to non-admitted patients: Not applicable  Risk to Others within the past 6 months Homicidal Ideation: No Does patient have any lifetime risk of violence toward others beyond the six months prior to admission? : No Thoughts of Harm to Others: No Current Homicidal Intent: No Current Homicidal Plan: No Access to Homicidal Means: No Identified Victim: None History of harm to others?: No Assessment of Violence: None Noted Violent Behavior Description: Pt denies history of violence Does patient have access to weapons?: No Criminal Charges Pending?: No Does patient have a court date: No Is patient on probation?: No  Psychosis Hallucinations: None noted Delusions: None noted  Mental Status Report Appearance/Hygiene: In scrubs Eye Contact: Good Motor Activity: Unremarkable, Freedom of movement Speech: Logical/coherent Level of Consciousness: Alert, Crying Mood: Depressed Affect: Depressed Anxiety Level: Moderate Thought Processes: Coherent, Relevant Judgement: Partial Orientation: Person, Place, Time, Situation, Appropriate for developmental age Obsessive Compulsive Thoughts/Behaviors: None  Cognitive Functioning Concentration: Fair Memory: Recent Intact, Remote Intact IQ: Average Insight: Fair Impulse Control: Fair Appetite:  Good Weight Loss: 0 Weight Gain: 0 Sleep: Increased  Total Hours of Sleep: 12 Vegetative Symptoms: None  ADLScreening [facility] Assessment Services) Patient's cognitive ability adequate to safely complete daily activities?: Yes Patient able to express need for assistance with ADLs?: Yes Independently performs ADLs?: Yes (appropriate for developmental age)  Prior Inpatient Therapy Prior Inpatient Therapy: No Prior Therapy Dates: NA Prior Therapy Facilty/Provider(s): NA Reason for Treatment: NA  Prior Outpatient Therapy Prior Outpatient Therapy: Yes Prior Therapy Dates: 2014 Prior Therapy Facilty/Provider(s): [facility] Reason for Treatment: Depression Does patient have an ACCT team?: No Does patient have Intensive In-House Services?  : No Does patient have [facility] services? : No Does patient have P4CC services?: No  ADL Screening (condition at time of admission) Patient's cognitive ability adequate to safely complete daily activities?: Yes Is the patient deaf or have difficulty hearing?: No Does the patient have difficulty seeing, even when wearing glasses/contacts?: No Does the patient have difficulty concentrating, remembering, or making decisions?: No Patient able to express need for assistance with ADLs?: Yes Does the patient have difficulty dressing or bathing?: No Independently performs ADLs?: Yes (appropriate for developmental age) Does the patient have difficulty walking or climbing stairs?: No Weakness of Legs: None Weakness of Arms/Hands: None  Home Assistive Devices/Equipment Home Assistive Devices/Equipment: None    Abuse/Neglect Assessment (Assessment to be complete while patient is alone) Physical Abuse: Denies Verbal Abuse: Denies Sexual Abuse: Denies Exploitation of patient/patient's resources: Denies Self-Neglect: Denies     Merchant navy officerAdvance Directives (For Healthcare) Does Patient Have a Medical Advance Directive?: No Would patient like information on creating a  medical advance directive?: No - Patient declined    Additional Information 1:1 In Past 12 Months?: No CIRT Risk: No Elopement Risk: Yes Does patient have medical clearance?: Yes     Disposition: Binnie RailJoAnn Glover[facility], confirmed adult unit is at capacity. Gave clinical report to Nira ConnJason Berry, NP who said Pt meets criteria for inpatient psychiatric treatment. TTS will contact facilities for placement. Notified Dr. Rolland PorterMark James and Marin CommentElaine Regan, RN of recommendation.  Disposition Initial Assessment Completed for this Encounter: Yes Disposition of Patient: Inpatient treatment program Type of inpatient treatment program: Adult   Pamalee LeydenFord Ellis Jeshurun [place] Jr, [facility], [facility], [facility] Triage Specialist [phone]   Pamalee LeydenWarrick Jr, Phelan Goers Ellis 03/22/2017 11:42 PM

## 2017-03-22 NOTE — ED Notes (Addendum)
Pt stated "I shouldn't have come.  I called my husband from work.  He works in [employer], and asked him to bring me here.  It seems like the week before my cycle and the week after I get depressed.  I talked to my doctor about it last month.  I don't take any meds for it.  I was thinking about getting hit by something.  I shouldn't have come here.  I have 3 kids and they're with their father right now.  I've been having thoughts and I couldn't make them stop.  I see a black spot floating in my eye."  Pt is A & O x 4.

## 2017-03-22 NOTE — ED Notes (Signed)
TTS assessment in progress. 

## 2017-03-22 NOTE — ED Triage Notes (Signed)
Pt states about 3 hours ago prior to arrival to the department, she stood in front of traffic contemplating suicide. She is tearful and adds that she cannot think clearly and is under severe stress. Suicide precautions have been initiated immediately.

## 2017-03-22 NOTE — ED Notes (Signed)
Bed: WTR9 Expected date:  Expected time:  Means of arrival:  Comments: 

## 2017-03-22 NOTE — ED Notes (Signed)
Pt requesting this writer to call "Jamar and tell him I'm not coming home tonight."  Pt provided #, call placed & Jamar informed.

## 2017-03-23 DIAGNOSIS — F332 Major depressive disorder, recurrent severe without psychotic features: Secondary | ICD-10-CM | POA: Diagnosis present

## 2017-03-23 NOTE — ED Notes (Signed)
Hourly rounding reveals patient sleeping in room. No complaints, stable, in no acute distress. Q15 minute rounds and monitoring via Security Cameras to continue. 

## 2017-03-23 NOTE — [facility] Counselor (Signed)
Clinician [facility] [phone]  Redmond Pullingreylese D Abigial Newville, MS, [facility], [facility] Triage Specialist [phone]

## 2017-03-23 NOTE — BH Assessment (Signed)
Faxed clinical information to the following facilities for placement:  [facility]   [address]everley Allender Ellis Patsy BaltimoreWarrick Jr, [facility], [facility], [facility] Triage Specialist [phone]

## 2017-03-23 NOTE — ED Notes (Signed)
Hourly rounding reveals patient in room. No complaints, stable, in no acute distress. Q15 minute rounds and monitoring via Security Cameras to continue. 

## 2017-03-23 NOTE — Progress Notes (Signed)
Per Leslie Decker, [facility] by Dr. Wendall StadeKohl, MD. Call to report [phone]. Pt can be transported after Leslie Hoose9am. Gary, RN notified of the pt's acceptance. IVC will need to be faxed to [facility]. Leslie Decker, TTS counselor notified to fax IVC to [facility].  Leslie Decker, MSW, LCSWA TTS Specialist [phone]

## 2017-03-23 NOTE — ED Notes (Signed)
Pt. Transferred to [facility] from ED to room 35 after screening for contraband. Report to include Situation, Background, Assessment and Recommendations from [facility] HospitalElaine RN. Pt. Oriented to unit including Q15 minute rounds as well as the security cameras for their protection. Patient is alert and oriented, warm and dry in no acute distress. Patient denies SI, HI, and AVH. Pt. Encouraged to let me know if needs arise.

## 2017-03-23 NOTE — ED Notes (Signed)
[facility] with [facility] deputy.  Pt was calm and cooperative and was in no distress.  All belongings were returned to patient.  15 minute checks and video monitoring in place.

## 2017-03-23 NOTE — ED Notes (Signed)
Pt. Made aware of impending transfer to [facility]. Pt. Less than happy about transfer but did not refuse.

## 2017-04-29 ENCOUNTER — Encounter (HOSPITAL_COMMUNITY): Payer: Self-pay | Admitting: Nurse Practitioner

## 2018-12-11 ENCOUNTER — Emergency Department (HOSPITAL_COMMUNITY)
Admission: EM | Admit: 2018-12-11 | Discharge: 2018-12-11 | Disposition: A | Discharge status: Home / Self Care | Payer: Medicaid Other | Attending: Emergency Medicine | Admitting: Emergency Medicine

## 2018-12-11 ENCOUNTER — Other Ambulatory Visit: Payer: Self-pay

## 2018-12-11 ENCOUNTER — Encounter (HOSPITAL_COMMUNITY): Payer: Self-pay | Admitting: *Deleted

## 2018-12-11 DIAGNOSIS — R110 Nausea: Secondary | ICD-10-CM | POA: Insufficient documentation

## 2018-12-11 DIAGNOSIS — J45909 Unspecified asthma, uncomplicated: Secondary | ICD-10-CM | POA: Insufficient documentation

## 2018-12-11 DIAGNOSIS — R509 Fever, unspecified: Secondary | ICD-10-CM

## 2018-12-11 DIAGNOSIS — R197 Diarrhea, unspecified: Secondary | ICD-10-CM | POA: Diagnosis not present

## 2018-12-11 LAB — URINALYSIS, ROUTINE W REFLEX MICROSCOPIC
Bilirubin Urine: NEGATIVE
GLUCOSE, UA: NEGATIVE mg/dL
Ketones, ur: 20 mg/dL — AB
Leukocytes,Ua: NEGATIVE
Nitrite: NEGATIVE
PH: 5 (ref 5.0–8.0)
Protein, ur: 30 mg/dL — AB
SPECIFIC GRAVITY, URINE: 1.021 (ref 1.005–1.030)

## 2018-12-11 LAB — CBC WITH DIFFERENTIAL/PLATELET
ABS IMMATURE GRANULOCYTES: 0.02 10*3/uL (ref 0.00–0.07)
Basophils Absolute: 0 10*3/uL (ref 0.0–0.1)
Basophils Relative: 0 %
Eosinophils Absolute: 0.2 10*3/uL (ref 0.0–0.5)
Eosinophils Relative: 3 %
HCT: 37.2 % (ref 36.0–46.0)
HEMOGLOBIN: 11.9 g/dL — AB (ref 12.0–15.0)
Immature Granulocytes: 0 %
LYMPHS PCT: 12 %
Lymphs Abs: 1.1 10*3/uL (ref 0.7–4.0)
MCH: 26.4 pg (ref 26.0–34.0)
MCHC: 32 g/dL (ref 30.0–36.0)
MCV: 82.5 fL (ref 80.0–100.0)
Monocytes Absolute: 0.7 10*3/uL (ref 0.1–1.0)
Monocytes Relative: 8 %
NEUTROS ABS: 6.8 10*3/uL (ref 1.7–7.7)
NRBC: 0 % (ref 0.0–0.2)
Neutrophils Relative %: 77 %
Platelets: 206 10*3/uL (ref 150–400)
RBC: 4.51 MIL/uL (ref 3.87–5.11)
RDW: 13.4 % (ref 11.5–15.5)
WBC: 8.7 10*3/uL (ref 4.0–10.5)

## 2018-12-11 LAB — COMPREHENSIVE METABOLIC PANEL
ALK PHOS: 47 U/L (ref 38–126)
ALT: 14 U/L (ref 0–44)
AST: 13 U/L — AB (ref 15–41)
Albumin: 3.6 g/dL (ref 3.5–5.0)
Anion gap: 8 (ref 5–15)
BILIRUBIN TOTAL: 0.5 mg/dL (ref 0.3–1.2)
BUN: 8 mg/dL (ref 6–20)
CALCIUM: 8.5 mg/dL — AB (ref 8.9–10.3)
CO2: 19 mmol/L — ABNORMAL LOW (ref 22–32)
CREATININE: 0.81 mg/dL (ref 0.44–1.00)
Chloride: 107 mmol/L (ref 98–111)
GFR calc Af Amer: >60 mL/min (ref >60)
Glucose, Bld: 110 mg/dL — ABNORMAL HIGH (ref 70–99)
POTASSIUM: 3.1 mmol/L — AB (ref 3.5–5.1)
Sodium: 134 mmol/L — ABNORMAL LOW (ref 135–145)
TOTAL PROTEIN: 6.6 g/dL (ref 6.5–8.1)

## 2018-12-11 LAB — LACTIC ACID, PLASMA: LACTIC ACID, VENOUS: 0.6 mmol/L (ref 0.5–1.9)

## 2018-12-11 LAB — I-STAT BETA HCG BLOOD, ED (MC, WL, AP ONLY): I-stat hCG, quantitative: <5 m[IU]/mL (ref <5)

## 2018-12-11 LAB — LIPASE, BLOOD: Lipase: 23 U/L (ref 11–51)

## 2018-12-11 MED ORDER — ONDANSETRON 8 MG PO TBDP: ORAL_TABLET | ORAL | 0 refills | Status: DC

## 2018-12-11 MED ORDER — ACETAMINOPHEN 325 MG PO TABS
650.0000 mg | ORAL_TABLET | Freq: Once | ORAL | Status: AC | PRN
  Administered 2018-12-11: 650 mg via ORAL
  Filled 2018-12-11: qty 2

## 2018-12-11 MED ORDER — ONDANSETRON 4 MG PO TBDP
4.0000 mg | ORAL_TABLET | Freq: Once | ORAL | Status: AC | PRN
  Administered 2018-12-11: 4 mg via ORAL
  Filled 2018-12-11: qty 1

## 2018-12-11 MED ORDER — SODIUM CHLORIDE 0.9% FLUSH: 3.0000 mL | Freq: Once | INTRAVENOUS | Status: DC

## 2018-12-11 MED ORDER — SODIUM CHLORIDE 0.9 % IV BOLUS
1000.0000 mL | Freq: Once | INTRAVENOUS | Status: AC
  Administered 2018-12-11: 1000 mL via INTRAVENOUS

## 2018-12-11 MED ORDER — SODIUM CHLORIDE 0.9 % IV BOLUS
500.0000 mL | Freq: Once | INTRAVENOUS | Status: AC
  Administered 2018-12-11: 500 mL via INTRAVENOUS

## 2018-12-11 MED ORDER — ONDANSETRON 4 MG PO TBDP
4.0000 mg | ORAL_TABLET | Freq: Once | ORAL | Status: AC
  Administered 2018-12-11: 4 mg via ORAL
  Filled 2018-12-11: qty 1

## 2018-12-11 NOTE — ED Provider Notes (Signed)
[facility]-EMERGENCY DEPT Provider Note   CSN: [id number] Arrival date & time: 12/11/18  0112    History   Chief Complaint Chief Complaint  Patient presents with  . Fever  . Abdominal Pain  . Diarrhea    HPI Leslie Decker is a 38 y.o. female with a hx of asthma, obesity, GERD presents to the Emergency Department complaining of gradual, persistent, progressively worsening fever, nausea and diarrhea onset 2 ago.  Patient reports her son is sick with the same.  No suspicious food intake.  Patient reports that she has taken Imodium for the diarrhea but has not taken any medication for her fever.  She denies vomiting, bloody diarrhea.  She states she has had some nausea abdominal cramping but currently has no abdominal pain.  Nothing seems to make her symptoms better or worse.  She reports she has been eating and drinking only small amounts.  No known sick contacts.  No recent travel.  Patient denies headache, neck pain, chest pain, shortness of breath, weakness, dizziness, syncope, dysuria, hematuria.     The history is provided by the patient and medical records. No language interpreter was used.    Past Medical History:  Diagnosis Date  . Asthma     Patient Active Problem List   Diagnosis Date Noted  . 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 Obesity, morbid (HCC) 06/24/2011  . GERD (gastroesophageal reflux disease) 06/24/2011  . Asthma 06/24/2011    Past Surgical History:  Procedure Laterality Date  . NO PAST SURGERIES    . TUBAL LIGATION  06/25/2011   Procedure: POST PARTUM TUBAL LIGATION;  Surgeon: Esmeralda Arthur, MD;  Location: [facility];  Service: Gynecology;  Laterality: Bilateral;     OB History    Gravida  6   Para  3   Term  3   Preterm  0   AB  3   Living  1     SAB  0   TAB  3   Ectopic  0   Multiple      Live Births  1            Home Medications    Prior to  Admission medications   Medication Sig Start Date End Date Taking? Authorizing Provider  fluticasone (FLONASE) 50 MCG/ACT nasal spray Place 2 sprays into both nostrils daily. Patient not taking: Reported on 03/22/2017 11/24/13   Nassim Cosma, Dahlia Client, PA-C  guaiFENesin (MUCINEX) 600 MG 12 hr tablet Take 2 tablets (1,200 mg total) by mouth 2 (two) times daily. Patient not taking: Reported on 03/22/2017 11/24/13   Dorleen Kissel, Dahlia Client, PA-C  HYDROcodone-acetaminophen (HYCET) 7.5-325 mg/15 ml solution Take 15 mLs by mouth every 8 (eight) hours as needed for moderate pain. Patient not taking: Reported on 03/22/2017 02/23/17   Barrett Henle, PA-C  ondansetron [facility] ODT) 8 MG disintegrating tablet 8mg  ODT q4 hours prn nausea 12/11/18   Aydrian Halpin, Dahlia Client, PA-C  predniSONE (DELTASONE) 10 MG tablet Take 6 on day 1 Take 5 on day 2 Take 4 on day 3 Take 3 on day 4 Take 2 on day 5 Take 1 on day 6 Patient not taking: Reported on 03/22/2017 05/23/15   Elpidio Anis, PA-C    Family History Family History  Problem Relation Age of Onset  . Hypertension Maternal Grandmother   . Diabetes Maternal Grandmother     Social History Social History   Tobacco Use  . Smoking status: Never  Smoker  Substance Use Topics  . Alcohol use: No  . Drug use: No     Allergies   Patient has no known allergies.   Review of Systems Review of Systems  Constitutional: Positive for fever.  Gastrointestinal: Positive for abdominal pain and diarrhea.     Physical Exam Updated Vital Signs BP 133/68 (BP Location: Left Arm)   Pulse (!) 106   Temp (!) 100.4 F (38 C) (Oral)   Resp 18   Ht 5\' 6"  (1.676 m)   Wt 117.9 kg   LMP 11/27/2018 (Approximate)   SpO2 100%   BMI 41.97 kg/m   Physical Exam Vitals signs and nursing note reviewed.  Constitutional:      General: She is not in acute distress.    Appearance: She is well-developed. She is not diaphoretic.     Comments: Awake, alert, nontoxic appearance   HENT:     Head: Normocephalic and atraumatic.     Comments: Mucous membranes are slightly dry    Mouth/Throat:     Pharynx: No oropharyngeal exudate.  Eyes:     General: No scleral icterus.    Conjunctiva/sclera: Conjunctivae normal.  Neck:     Musculoskeletal: Normal range of motion and neck supple.  Cardiovascular:     Rate and Rhythm: Regular rhythm. Tachycardia present.  Pulmonary:     Effort: Pulmonary effort is normal. No respiratory distress.     Breath sounds: Normal breath sounds. No wheezing.  Abdominal:     General: Bowel sounds are normal.     Palpations: Abdomen is soft. There is no mass.     Tenderness: There is no abdominal tenderness. There is no guarding or rebound.  Musculoskeletal: Normal range of motion.  Skin:    General: Skin is warm and dry.     Comments: Hot to touch  Neurological:     Mental Status: She is alert.     Comments: Speech is clear and goal oriented Moves extremities without ataxia      ED Treatments / Results  Labs (all labs ordered are listed, but only abnormal results are displayed) Labs Reviewed  COMPREHENSIVE METABOLIC PANEL - Abnormal; Notable for the following components:      Result Value   Sodium 134 (*)    Potassium 3.1 (*)    CO2 19 (*)    Glucose, Bld 110 (*)    Calcium 8.5 (*)    AST 13 (*)    All other components within normal limits  URINALYSIS, ROUTINE W REFLEX MICROSCOPIC - Abnormal; Notable for the following components:   APPearance HAZY (*)    Hgb urine dipstick MODERATE (*)    Ketones, ur 20 (*)    Protein, ur 30 (*)    Bacteria, UA RARE (*)    All other components within normal limits  CBC WITH DIFFERENTIAL/PLATELET - Abnormal; Notable for the following components:   Hemoglobin 11.9 (*)    All other components within normal limits  LIPASE, BLOOD  LACTIC ACID, PLASMA  I-STAT BETA HCG BLOOD, ED ([facility], [facility], [facility] ONLY)     Procedures Procedures (including critical care time)  Medications Ordered in ED  Medications  sodium chloride flush (NS) 0.9 % injection 3 mL (has no administration in time range)  ondansetron (ZOFRAN-ODT) disintegrating tablet 4 mg (4 mg Oral Given 12/11/18 0209)  acetaminophen (TYLENOL) tablet 650 mg (650 mg Oral Given 12/11/18 0209)  sodium chloride 0.9 % bolus 1,000 mL (0 mLs Intravenous Stopped 12/11/18 0400)  sodium chloride 0.9 % bolus 500 mL (0 mLs Intravenous Stopped 12/11/18 0441)  ondansetron (ZOFRAN-ODT) disintegrating tablet 4 mg (4 mg Oral Given 12/11/18 0528)     Initial Impression / Assessment and Plan / ED Course  I have reviewed the triage vital signs and the nursing notes.  Pertinent labs & imaging results that were available during my care of the patient were reviewed by me and considered in my medical decision making (see chart for details).  Clinical Course as of Dec 11 543  Sat Dec 11, 2018  0544 Mild Hypokalemia as expected with persistent diarrhea  Potassium(!): 3.1 [HM]  0545 Ketones and protein in patient's urine.  Suspect this is secondary to dehydration.  Fluids have been given.  Ketones, ur(!): 20 [HM]  0545 Fever and tachycardia improved.  Patient still slightly tachycardic at discharge but has no chest pain or shortness of breath.  Temp: 99.7 F (37.6 C) [HM]    Clinical Course User Index [HM] Janisha Bueso, Dahlia Client, PA-C       Patient with symptoms consistent with viral gastritis.  Pt's son is sick with the same.  Pt with fever and tachycardia.  Antipyretics given with improvement in vital signs.  Mild signs of dehydration, given fluids.  Tolerating PO fluids > 6 oz in the ED.  Lungs are clear.  No focal abdominal pain, no concern for appendicitis, cholecystitis, pancreatitis, ruptured viscus, UTI, kidney stone, or any other abdominal etiology.  Supportive therapy indicated with return if symptoms worsen.  Patient counseled.  BP (!) 92/54 (BP Location: Left Arm)   Pulse 99   Temp 99.7 F (37.6 C) (Oral)   Resp 16   Ht  (1.676  m)   Wt 117.9 kg   LMP 11/27/2018 (Approximate)   SpO2 100%   BMI 41.97 kg/m     Final Clinical Impressions(s) / ED Diagnoses   Final diagnoses:  Fever, unspecified fever cause  Diarrhea of presumed infectious origin  Nausea    ED Discharge Orders         Ordered    ondansetron (ZOFRAN ODT) 8 MG disintegrating tablet     12/11/18 0504           Kathren Scearce, Dahlia Client, PA-C 12/11/18 0545    Molpus, Jonny Ruiz, MD 12/11/18 [phone]

## 2018-12-11 NOTE — ED Notes (Signed)
Urine culture sent to the lab. 

## 2018-12-11 NOTE — Discharge Instructions (Addendum)
1. Medications: zofran, usual home medications °2. Treatment: rest, drink plenty of fluids, advance diet slowly °3. Follow Up: Please followup with your primary doctor in 2 days for discussion of your diagnoses and further evaluation after today's visit; if you do not have a primary care doctor use the resource guide provided to find one; Please return to the ER for persistent vomiting, high fevers or worsening symptoms ° °

## 2018-12-11 NOTE — ED Triage Notes (Signed)
Pt c/o abd pain with diarrhea & fever since Thursday.  Has not taken any anti-pyretics but has taken Immodium.

## 2021-01-22 ENCOUNTER — Emergency Department (HOSPITAL_BASED_OUTPATIENT_CLINIC_OR_DEPARTMENT_OTHER): Payer: Medicaid Other

## 2021-01-22 ENCOUNTER — Other Ambulatory Visit: Payer: Self-pay

## 2021-01-22 ENCOUNTER — Encounter (HOSPITAL_BASED_OUTPATIENT_CLINIC_OR_DEPARTMENT_OTHER): Payer: Self-pay | Admitting: *Deleted

## 2021-01-22 ENCOUNTER — Emergency Department (HOSPITAL_BASED_OUTPATIENT_CLINIC_OR_DEPARTMENT_OTHER)
Admission: EM | Admit: 2021-01-22 | Discharge: 2021-01-22 | Disposition: A | Discharge status: Home / Self Care | Payer: Medicaid Other | Attending: Emergency Medicine | Admitting: Emergency Medicine

## 2021-01-22 DIAGNOSIS — U071 COVID-19: Secondary | ICD-10-CM | POA: Diagnosis not present

## 2021-01-22 DIAGNOSIS — Z7951 Long term (current) use of inhaled steroids: Secondary | ICD-10-CM | POA: Insufficient documentation

## 2021-01-22 DIAGNOSIS — J069 Acute upper respiratory infection, unspecified: Secondary | ICD-10-CM | POA: Diagnosis not present

## 2021-01-22 DIAGNOSIS — J45909 Unspecified asthma, uncomplicated: Secondary | ICD-10-CM | POA: Diagnosis not present

## 2021-01-22 DIAGNOSIS — R059 Cough, unspecified: Secondary | ICD-10-CM | POA: Diagnosis present

## 2021-01-22 LAB — RESP PANEL BY RT-PCR (FLU A&B, COVID) ARPGX2
Influenza A by PCR: NEGATIVE
Influenza B by PCR: NEGATIVE
SARS Coronavirus 2 by RT PCR: POSITIVE — AB

## 2021-01-22 MED ORDER — IBUPROFEN 400 MG PO TABS
400.0000 mg | ORAL_TABLET | Freq: Once | ORAL | Status: AC
  Administered 2021-01-22: 400 mg via ORAL
  Filled 2021-01-22: qty 1

## 2021-01-22 MED ORDER — NIRMATRELVIR/RITONAVIR (PAXLOVID)TABLET: 3.0000 | ORAL_TABLET | Freq: Two times a day (BID) | ORAL | 0 refills | Status: AC

## 2021-01-22 NOTE — ED Notes (Signed)
ED Provider at bedside. 

## 2021-01-22 NOTE — ED Provider Notes (Signed)
[facility] EMERGENCY DEPARTMENT Provider Note   CSN: [id number] Arrival date & time: 01/22/21  1851     History Chief Complaint  Patient presents with  . Fever  . Shortness of Breath    Leslie Decker is a 40 y.o. female.  The history is provided by the patient and medical records. No language interpreter was used.  URI Presenting symptoms: congestion, cough, fatigue, fever and rhinorrhea   Presenting symptoms: no ear pain, no facial pain and no sore throat   Severity:  Moderate Onset quality:  Gradual Duration:  3 days Timing:  Constant Progression:  Unchanged Chronicity:  New Relieved by:  Nothing Worsened by:  Nothing Ineffective treatments:  None tried Associated symptoms: no headaches, no neck pain and no wheezing        Past Medical History:  Diagnosis Date  . Asthma     Patient Active Problem List   Diagnosis Date Noted  . 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 Obesity, morbid (HCC) 06/24/2011  . GERD (gastroesophageal reflux disease) 06/24/2011  . Asthma 06/24/2011    Past Surgical History:  Procedure Laterality Date  . NO PAST SURGERIES    . TUBAL LIGATION  06/25/2011   Procedure: POST PARTUM TUBAL LIGATION;  Surgeon: Esmeralda Arthur, MD;  Location: [facility];  Service: Gynecology;  Laterality: Bilateral;     OB History    Gravida  6   Para  3   Term  3   Preterm  0   AB  3   Living  1     SAB  0   IAB  3   Ectopic  0   Multiple      Live Births  1           Family History  Problem Relation Age of Onset  . Hypertension Maternal Grandmother   . Diabetes Maternal Grandmother     Social History   Tobacco Use  . Smoking status: Never Smoker  . Smokeless tobacco: Never Used  Substance Use Topics  . Alcohol use: No  . Drug use: No    Home Medications Prior to Admission medications   Medication Sig Start Date End Date Taking? Authorizing Provider   fluticasone (FLONASE) 50 MCG/ACT nasal spray Place 2 sprays into both nostrils daily. 11/24/13  Yes Muthersbaugh, Dahlia Client, PA-C  guaiFENesin (MUCINEX) 600 MG 12 hr tablet Take 2 tablets (1,200 mg total) by mouth 2 (two) times daily. Patient not taking: No sig reported 11/24/13   Muthersbaugh, Dahlia Client, PA-C  HYDROcodone-acetaminophen (HYCET) 7.5-325 mg/15 ml solution Take 15 mLs by mouth every 8 (eight) hours as needed for moderate pain. Patient not taking: No sig reported 02/23/17   Barrett Henle, PA-C  ondansetron [facility] ODT) 8 MG disintegrating tablet 8mg  ODT q4 hours prn nausea 12/11/18   Muthersbaugh, 12/13/18, PA-C  predniSONE (DELTASONE) 10 MG tablet Take 6 on day 1 Take 5 on day 2 Take 4 on day 3 Take 3 on day 4 Take 2 on day 5 Take 1 on day 6 Patient not taking: No sig reported 05/23/15   05/25/15, PA-C    Allergies    Patient has no known allergies.  Review of Systems   Review of Systems  Constitutional: Positive for chills, fatigue and fever. Negative for diaphoresis.  HENT: Positive for congestion and rhinorrhea. Negative for ear pain and sore throat.   Eyes: Negative for visual disturbance.  Respiratory: Positive for cough  and chest tightness. Negative for shortness of breath and wheezing.   Cardiovascular: Negative for chest pain and palpitations.  Gastrointestinal: Negative for abdominal pain, constipation, diarrhea, nausea and vomiting.  Genitourinary: Negative for dysuria, flank pain and frequency.  Musculoskeletal: Negative for back pain, neck pain and neck stiffness.  Skin: Negative for rash and wound.  Neurological: Negative for dizziness, light-headedness and headaches.  Psychiatric/Behavioral: Negative for agitation and confusion.  All other systems reviewed and are negative.   Physical Exam Updated Vital Signs BP 132/88   Pulse (!) 111   Temp (!) 101.4 F (38.6 C) (Oral)   Resp 20   Ht 5\' 5"  (1.651 m)   Wt (!) 139.5 kg   LMP 01/08/2021   SpO2  97%   BMI 51.18 kg/m   Physical Exam Vitals and nursing note reviewed.  Constitutional:      General: She is not in acute distress.    Appearance: She is well-developed. She is obese. She is not ill-appearing, toxic-appearing or diaphoretic.  HENT:     Head: Normocephalic and atraumatic.  Eyes:     Conjunctiva/sclera: Conjunctivae normal.     Pupils: Pupils are equal, round, and reactive to light.  Cardiovascular:     Rate and Rhythm: Normal rate and regular rhythm.     Heart sounds: No murmur heard.   Pulmonary:     Effort: Pulmonary effort is normal. No tachypnea or respiratory distress.     Breath sounds: Rhonchi present. No decreased breath sounds, wheezing or rales.  Chest:     Chest wall: No tenderness.  Abdominal:     Palpations: Abdomen is soft.     Tenderness: There is no abdominal tenderness.  Musculoskeletal:     Cervical back: Neck supple.     Right lower leg: No tenderness. No edema.     Left lower leg: No tenderness. No edema.  Skin:    General: Skin is warm and dry.     Capillary Refill: Capillary refill takes less than 2 seconds.  Neurological:     General: No focal deficit present.     Mental Status: She is alert.  Psychiatric:        Mood and Affect: Mood normal.     ED Results / Procedures / Treatments   Labs (all labs ordered are listed, but only abnormal results are displayed) Labs Reviewed  RESP PANEL BY RT-PCR (FLU A&B, COVID) ARPGX2 - Abnormal; Notable for the following components:      Result Value   SARS Coronavirus 2 by RT PCR POSITIVE (*)    All other components within normal limits    EKG EKG Interpretation  Date/Time:  Tuesday Jan 22 2021 20:04:29 EDT Ventricular Rate:  100 PR Interval:  152 QRS Duration: 87 QT Interval:  337 QTC Calculation: 435 R Axis:   56 Text Interpretation: Sinus tachycardia Borderline T wave abnormalities when compared to prior, slower rate. No STEMI Confirmed by 11-09-1992 (Theda Belfast) on 01/22/2021  8:14:39 PM   Radiology DG Chest Portable 1 View  Result Date: 01/22/2021 CLINICAL DATA:  Fever chills and cough.  Rule out pneumonia EXAM: PORTABLE CHEST 1 VIEW COMPARISON:  None. FINDINGS: The heart size and mediastinal contours are within normal limits. Both lungs are clear. The visualized skeletal structures are unremarkable. IMPRESSION: No active disease. Electronically Signed   By: 03/24/2021 M.D.   On: 01/22/2021 20:20    Procedures Procedures   Leslie Decker was evaluated in Emergency Department on 01/23/2021 for  the symptoms described in the history of present illness. She was evaluated in the context of the global COVID-19 pandemic, which necessitated consideration that the patient might be at risk for infection with the SARS-CoV-2 virus that causes COVID-19. Institutional protocols and algorithms that pertain to the evaluation of patients at risk for COVID-19 are in a state of rapid change based on information released by regulatory bodies including the CDC and federal and state organizations. These policies and algorithms were followed during the patient's care in the ED.   Medications Ordered in ED Medications  ibuprofen (ADVIL) tablet 400 mg (400 mg Oral Given 01/22/21 1906)    ED Course  I have reviewed the triage vital signs and the nursing notes.  Pertinent labs & imaging results that were available during my care of the patient were reviewed by me and considered in my medical decision making (see chart for details).    MDM Rules/Calculators/A&P                          Leslie Decker is a 40 y.o. female with a past medical history significant for GERD, asthma, depression, and morbid obesity who presents with URI symptoms, congestion, chills, myalgias, shortness of breath and cough patient reports that for the last 2 days, she has been having URI symptoms developing.  She reports that.  She has not had any sick contact to her knowledge.  She is not vaccinated against  COVID-19 but has not had COVID over the last 2 years during the pandemic.  She reports no chest pain but does report some mild chest tightness when she is coughing.  It is more of a soreness.  She reports that it is not constant at rest.  She reports no constant shortness of breath either.  She denies nausea, vomiting, constipation, diarrhea, dysuria.  Denies rashes.  Denies significant pain aside from diffuse myalgias.  Denies headache or neck pain or neck stiffness.  She reports for the last 2 days she has had subjective chills but today spiked a fever and is 101.4 on arrival here she has taken Tylenol earlier.  She denies history of DVT or PE and denies any leg pain or leg swelling.  She think she just has a URI but wanted to get evaluated and have pneumonia ruled out as well as COVID.  On exam, lungs had some mild coarseness but there is no significant wheezing despite her history of asthma.  Chest and abdomen nontender.  Good pulses in all extremities.  No lower extremity tenderness or edema appreciated.  Exam otherwise unremarkable.  Patient was slightly tachycardic on monitor evaluation and she was febrile and warm to the touch.  She is not hypoxic or hypotensive.  Patient resting.  We will get x-ray to rule out pneumonia and will get a COVID and flu swab for the patient.  Given her lack of hypoxia, I have low sufficient she will need admission.   I suspect tachycardia is related to fever.  She otherwise appears well.  Patient agrees to hold on lab testing but will get an EKG given the chest tightness.  If patient's work-up is reassuring with no evidence of pneumonia, dissipate discharge home for outpatient follow-up given her otherwise well appearance.  Patient's COVID test is positive.  X-ray shows no pneumonia.  Given patient's new onset of symptoms and her comorbidities, pharmacy feel she is appropriate for outpatient oral medication with Paxlovid.  Prescription was printed and  patient agrees  with return precautions and follow-up instructions.  She no other questions or concerns and was discharged in good condition with diagnosis of COVID.    Final Clinical Impression(s) / ED Diagnoses Final diagnoses:  COVID-19  Upper respiratory tract infection, unspecified type    Rx / DC Orders ED Discharge Orders         Ordered    nirmatrelvir/ritonavir EUA (PAXLOVID) TABS  2 times daily        01/22/21 2108         Clinical Impression: 1. COVID-19   2. Upper respiratory tract infection, unspecified type     Disposition: Discharge  Condition: Good  I have discussed the results, Dx and Tx plan with the pt(& family if present). He/she/they expressed understanding and agree(s) with the plan. Discharge instructions discussed at great length. Strict return precautions discussed and pt &/or family have verbalized understanding of the instructions. No further questions at time of discharge.    Discharge Medication List as of 01/22/2021 10:05 PM    START taking these medications   Details  nirmatrelvir/ritonavir EUA (PAXLOVID) TABS Take 3 tablets by mouth 2 (two) times daily for 5 days. Take nirmatrelvir (150 mg) 2 tablet(s) twice daily for 5 days and ritonavir (100 mg) one tablet twice daily for 5 days., Starting Tue 01/22/2021, Until Sun 01/27/2021, Print        Follow Up: Medicine, [facility] EMERGENCY DEPARTMENT [address] [id number] DX IPJA [place] [address] [phone]       Amado Andal, Canary Brim, MD 01/23/21 [phone]

## 2021-01-22 NOTE — Discharge Instructions (Signed)
Your history, exam, work-up today are consistent with COVID-19 and action causing her symptoms.  Your oxygen saturations were reassuring thus we feel you are safe for discharge home at this time.  I spoke to pharmacy who agrees that you qualify for the outpatient oral COVID medicine which I prescribed.  Please take this for the next 5 days.  Please rest and stay hydrated and treat any fevers at home with Motrin and Tylenol.  If any symptoms change or worsen acutely, please return to the nearest emergency department for reevaluation.

## 2021-01-22 NOTE — ED Triage Notes (Signed)
Sob for an hour. Fever and chills this am. She took Tylenol 30 minutes ago. Body aches, sore throat, cough and fatigue.

## 2022-02-20 ENCOUNTER — Encounter (HOSPITAL_BASED_OUTPATIENT_CLINIC_OR_DEPARTMENT_OTHER): Payer: Self-pay | Admitting: Urology

## 2022-02-20 ENCOUNTER — Other Ambulatory Visit: Payer: Self-pay

## 2022-02-20 ENCOUNTER — Emergency Department (HOSPITAL_BASED_OUTPATIENT_CLINIC_OR_DEPARTMENT_OTHER): Payer: BC Managed Care – PPO

## 2022-02-20 ENCOUNTER — Emergency Department (HOSPITAL_BASED_OUTPATIENT_CLINIC_OR_DEPARTMENT_OTHER)
Admission: EM | Admit: 2022-02-20 | Discharge: 2022-02-21 | Disposition: A | Discharge status: Home / Self Care | Payer: BC Managed Care – PPO | Attending: Emergency Medicine | Admitting: Emergency Medicine

## 2022-02-20 DIAGNOSIS — J45909 Unspecified asthma, uncomplicated: Secondary | ICD-10-CM | POA: Diagnosis not present

## 2022-02-20 DIAGNOSIS — W010XXA Fall on same level from slipping, tripping and stumbling without subsequent striking against object, initial encounter: Secondary | ICD-10-CM | POA: Insufficient documentation

## 2022-02-20 DIAGNOSIS — S8992XA Unspecified injury of left lower leg, initial encounter: Secondary | ICD-10-CM | POA: Diagnosis present

## 2022-02-20 DIAGNOSIS — S8002XA Contusion of left knee, initial encounter: Secondary | ICD-10-CM | POA: Diagnosis not present

## 2022-02-20 MED ORDER — NAPROXEN 250 MG PO TABS
500.0000 mg | ORAL_TABLET | Freq: Once | ORAL | Status: AC
  Administered 2022-02-21: 500 mg via ORAL
  Filled 2022-02-20: qty 2

## 2022-02-20 MED ORDER — NAPROXEN 500 MG PO TABS: 500.0000 mg | ORAL_TABLET | Freq: Two times a day (BID) | ORAL | 0 refills | Status: DC

## 2022-02-20 NOTE — ED Provider Notes (Signed)
[facility] EMERGENCY DEPARTMENT Provider Note   CSN: [id number] Arrival date & time: 02/20/22  2222     History  Chief Complaint  Patient presents with   Knee Pain    Leslie Decker is a 41 y.o. female with chief complaint of left knee pain.  Patient was at the store getting supplies and did not notice a wet floor sign.  Patient slipped on the wet floor and fell directly onto her left knee.  Denies hitting head or losing consciousness.  Describes the knee as tender with painful range of motion.  Is nervous it may be broken and came to get it evaluated.  Denies numbness, tingling, or weakness of legs.  The history is provided by the patient and medical records.  Knee Pain     Home Medications Prior to Admission medications   Medication Sig Start Date End Date Taking? Authorizing Provider  naproxen (NAPROSYN) 500 MG tablet Take 1 tablet (500 mg total) by mouth 2 (two) times daily. 02/20/22  Yes Cecil Cobbs, PA-C  ondansetron (ZOFRAN ODT) 8 MG disintegrating tablet 8mg  ODT q4 hours prn nausea 12/11/18   Muthersbaugh, 12/13/18, PA-C      Allergies    Patient has no known allergies.    Review of Systems   Review of Systems  Musculoskeletal:        Left knee pain   Physical Exam Updated Vital Signs BP (!) 160/107 (BP Location: Left Arm)   Pulse 86   Temp 98.7 F (37.1 C) (Oral)   Resp 18   Ht 5\' 5"  (1.651 m)   Wt (!) 139.5 kg   SpO2 94%   BMI 51.18 kg/m  Physical Exam Vitals and nursing note reviewed.  Constitutional:      General: She is not in acute distress.    Appearance: She is well-developed. She is not ill-appearing or diaphoretic.  HENT:     Head: Normocephalic and atraumatic.  Eyes:     Conjunctiva/sclera: Conjunctivae normal.  Cardiovascular:     Rate and Rhythm: Normal rate and regular rhythm.     Pulses: Normal pulses.  Pulmonary:     Effort: Pulmonary effort is normal. No respiratory distress.     Breath sounds: Normal breath sounds.   Abdominal:     Palpations: Abdomen is soft.     Tenderness: There is no abdominal tenderness.  Musculoskeletal:        General: No swelling.     Cervical back: Neck supple.       Legs:     Comments: Tenderness as indicated above.  Mildly reduced flexion due to elicited tenderness.  No obvious malalignment, bony deformity, effusion, ecchymosis, bruising, swelling, or wound appreciated.  Crepitus in both knees appears equal.  No lateral or medial joint line tenderness, or posterior knee joint tenderness.  Valgus and varus negative.  Skin:    General: Skin is warm and dry.     Capillary Refill: Capillary refill takes less than 2 seconds.  Neurological:     Mental Status: She is alert and oriented to person, place, and time.  Psychiatric:        Mood and Affect: Mood normal.    ED Results / Procedures / Treatments   Labs (all labs ordered are listed, but only abnormal results are displayed) Labs Reviewed - No data to display  EKG None  Radiology DG Knee Complete 4 Views Left  Result Date: 02/20/2022 CLINICAL DATA:  Status post fall. EXAM: LEFT KNEE -  COMPLETE 4+ VIEW COMPARISON:  None Available. FINDINGS: No evidence of fracture, dislocation, or joint effusion. Early medial marginal osteophyte formation is noted. Soft tissues are unremarkable. IMPRESSION: No acute osseous abnormality. Electronically Signed   By: Aram Candela M.D.   On: 02/20/2022 22:51    Procedures Procedures    Medications Ordered in ED Medications  naproxen (NAPROSYN) tablet 500 mg (has no administration in time range)    ED Course/ Medical Decision Making/ A&P                           Medical Decision Making Amount and/or Complexity of Data Reviewed External Data Reviewed: notes. Labs: ordered. Decision-making details documented in ED Course. Radiology: ordered and independent interpretation performed. Decision-making details documented in ED Course. ECG/medicine tests: ordered and independent  interpretation performed. Decision-making details documented in ED Course.  Risk OTC drugs. Prescription drug management.   41 y.o. female presents to the ED for concern of Knee Pain   This involves an extensive number of treatment options, and is a complaint that carries with it a high risk of complications and morbidity.  The emergent differential diagnosis prior to evaluation includes, but is not limited to: Fracture, dislocation, contusion  This is not an exhaustive differential.   Past Medical History / Co-morbidities / Social History: Hx of morbid obesity, GERD, asthma, MDD, prior tubal ligation.  Physical Exam: Physical exam performed. The pertinent findings include: Direct tenderness over the patella.  Lab Tests: None  Imaging Studies: I ordered imaging studies including x-ray of left knee.  I independently visualized and interpreted said imaging.  Pertinent results include: No acute bony abnormality or dislocation. I agree with the radiologist interpretation.  ED Course/Disposition: Pt well-appearing on exam.  Presenting with left knee pain after fall with direct impact.  Patient X-Ray negative for obvious fracture or dislocation.  No medial or lateral joint line tenderness.  Without significant joint laxity on exam.  Valgus and varus test negative.  Pain managed in ED.  Pt advised to follow up with orthopedics if symptoms persist for possibility of missed fracture diagnosis or if symptoms persist.  Pt given brace while in ED.  Offered crutches, patient deferred at this time.  Conservative therapy recommended and discussed.  Patient in NAD and good condition at time of discharge.  After consideration of the diagnostic results and the patient's encounter today, I feel that the emergency department workup does not suggest an emergent condition requiring admission or immediate intervention beyond what has been performed at this time.  The patient is safe for discharge and has been  instructed to return immediately for worsening symptoms, change in symptoms or any other concerns.  Discussed course of treatment thoroughly with the patient, whom demonstrated understanding.  Patient in agreement and has no further questions.  I discussed this case with my attending physician Dr. Rubin Payor, who agreed with the proposed treatment course and cosigned this note including patient's presenting symptoms, physical exam, and planned diagnostics and interventions.  Attending physician stated agreement with plan or made changes to plan which were implemented.     This chart was dictated using voice recognition software.  Despite best efforts to proofread, errors can occur which can change the documentation meaning.         Final Clinical Impression(s) / ED Diagnoses Final diagnoses:  Contusion of left knee, initial encounter    Rx / DC Orders ED Discharge Orders  Ordered    naproxen (NAPROSYN) 500 MG tablet  2 times daily        02/20/22 2335              Sandrea HammondCockerham, Seher Schlagel M, PA-C 02/20/22 2358    Benjiman CorePickering, Nathan, MD 02/21/22 0005

## 2022-02-20 NOTE — ED Triage Notes (Signed)
Pt states slipped on wet floor approx 20 min pta at floor States left knee pain, able to bear weight but painful

## 2022-02-20 NOTE — Discharge Instructions (Addendum)
A prescription by the name naproxen has been sent to your pharmacy.  This is an anti-inflammatory.  You may take 1 tablet every 12 hours as needed for pain relief.  You may take Tylenol at the same time, but do not take ibuprofen or Motrin with the naproxen.  Always take with food and plenty water.  Return to the ED for new or worsening symptoms as discussed.    You have been provided the contact information for a local orthopedic specialist by the name of Dr. Eulah Pont.  Please call and schedule an appointment if your symptoms continue.

## 2022-02-21 DIAGNOSIS — S8002XA Contusion of left knee, initial encounter: Secondary | ICD-10-CM | POA: Diagnosis not present

## 2022-04-25 ENCOUNTER — Encounter (HOSPITAL_BASED_OUTPATIENT_CLINIC_OR_DEPARTMENT_OTHER): Payer: Self-pay | Admitting: Emergency Medicine

## 2022-04-25 ENCOUNTER — Emergency Department (HOSPITAL_BASED_OUTPATIENT_CLINIC_OR_DEPARTMENT_OTHER)
Admission: EM | Admit: 2022-04-25 | Discharge: 2022-04-26 | Disposition: A | Discharge status: Home / Self Care | Payer: BC Managed Care – PPO | Attending: Emergency Medicine | Admitting: Emergency Medicine

## 2022-04-25 ENCOUNTER — Other Ambulatory Visit: Payer: Self-pay

## 2022-04-25 DIAGNOSIS — J039 Acute tonsillitis, unspecified: Secondary | ICD-10-CM | POA: Diagnosis not present

## 2022-04-25 DIAGNOSIS — J45909 Unspecified asthma, uncomplicated: Secondary | ICD-10-CM | POA: Insufficient documentation

## 2022-04-25 DIAGNOSIS — Z20822 Contact with and (suspected) exposure to covid-19: Secondary | ICD-10-CM | POA: Insufficient documentation

## 2022-04-25 DIAGNOSIS — J029 Acute pharyngitis, unspecified: Secondary | ICD-10-CM | POA: Diagnosis present

## 2022-04-25 LAB — SARS CORONAVIRUS 2 BY RT PCR: SARS Coronavirus 2 by RT PCR: NEGATIVE

## 2022-04-25 LAB — GROUP A STREP BY PCR: Group A Strep by PCR: NOT DETECTED

## 2022-04-25 NOTE — ED Triage Notes (Signed)
Sore throat with clogged ears x 3 days.  No other symptoms.

## 2022-04-26 DIAGNOSIS — J039 Acute tonsillitis, unspecified: Secondary | ICD-10-CM | POA: Diagnosis not present

## 2022-04-26 MED ORDER — AMOXICILLIN 500 MG PO CAPS: ORAL_CAPSULE | ORAL | 0 refills | Status: AC

## 2022-04-26 MED ORDER — FLUCONAZOLE 150 MG PO TABS: ORAL_TABLET | ORAL | 0 refills | Status: AC

## 2022-04-26 MED ORDER — AMOXICILLIN 500 MG PO CAPS
1000.0000 mg | ORAL_CAPSULE | Freq: Once | ORAL | Status: AC
Start: 2022-04-26 — End: 2022-04-26
  Administered 2022-04-26: 1000 mg via ORAL
  Filled 2022-04-26: qty 2

## 2022-04-26 NOTE — ED Provider Notes (Signed)
[facility] Provider Note: Lowella Dell, MD, FACEP  CSN: [id number] MRN: [id number] ARRIVAL: 04/25/22 at 2211 ROOM: MH10/[facility]10   CHIEF COMPLAINT  Sore Throat   HISTORY OF PRESENT ILLNESS  04/26/22 12:38 AM Leslie Decker is a 41 y.o. female with a sore throat for 3 days.  She rates her sore throat as a 9 out of 10, worse with swallowing.  She also feels like her ears are clogged.  The sore throat is located on the right side of the throat and feels like a lump.  She has not had a fever.  She has not had cold symptoms.   Past Medical History:  Diagnosis Date   Asthma     Past Surgical History:  Procedure Laterality Date   NO PAST SURGERIES     TUBAL LIGATION  06/25/2011   Procedure: POST PARTUM TUBAL LIGATION;  Surgeon: Esmeralda Arthur, MD;  Location: [facility];  Service: Gynecology;  Laterality: Bilateral;    Family History  Problem Relation Age of Onset   Hypertension Maternal Grandmother    Diabetes Maternal Grandmother     Social History   Tobacco Use   Smoking status: Never   Smokeless tobacco: Never  Substance Use Topics   Alcohol use: No   Drug use: No    Prior to Admission medications   Medication Sig Start Date End Date Taking? Authorizing Provider  amoxicillin (AMOXIL) 500 MG capsule Take 2 capsules (1000 mg) daily for 9 days starting the morning of 04/27/2022. 04/26/22  Yes Urie Loughner, MD  fluconazole (DIFLUCAN) 150 MG tablet Take 1 tablet as needed for vaginal yeast infection.  May repeat in 3 days if symptoms persist. 04/26/22  Yes Jaree Dwight, Jonny Ruiz, MD  naproxen (NAPROSYN) 500 MG tablet Take 1 tablet (500 mg total) by mouth 2 (two) times daily. 02/20/22   Cecil Cobbs, PA-C    Allergies Patient has no known allergies.   REVIEW OF SYSTEMS  Negative except as noted here or in the History of Present Illness.   PHYSICAL EXAMINATION  Initial Vital Signs Blood pressure (!) 158/98, pulse 79, temperature 98.1 F (36.7 C), temperature source  Oral, resp. rate 20, height 5\' 5"  (1.651 m), weight 136.1 kg, last menstrual period 04/10/2022, SpO2 99 %.  Examination General: Well-developed, well-nourished female in no acute distress; appearance consistent with age of record HENT: normocephalic; atraumatic; TMs normal; right tonsil mildly enlarged with exudate Eyes: Normal appearance Neck: supple; no lymphadenopathy Heart: regular rate and rhythm Lungs: clear to auscultation bilaterally Abdomen: soft; nondistended; nontender; bowel sounds present Extremities: No deformity; full range of motion; pulses normal Neurologic: Awake, alert and oriented; motor function intact in all extremities and symmetric; no facial droop Skin: Warm and dry Psychiatric: Normal mood and affect   RESULTS  Summary of this visit's results, reviewed and interpreted by myself:   EKG Interpretation  Date/Time:    Ventricular Rate:    PR Interval:    QRS Duration:   QT Interval:    QTC Calculation:   R Axis:     Text Interpretation:         Laboratory Studies: Results for orders placed or performed during the hospital encounter of 04/25/22 (from the past 24 hour(s))  Group A Strep by PCR     Status: None   Collection Time: 04/25/22 10:29 PM   Specimen: Throat; Sterile Swab  Result Value Ref Range   Group A Strep by PCR NOT DETECTED NOT DETECTED  SARS Coronavirus  2 by RT PCR (hospital order, performed in [facility] hospital lab) *cepheid single result test* Throat     Status: None   Collection Time: 04/25/22 10:29 PM   Specimen: Throat; Nasal Swab  Result Value Ref Range   SARS Coronavirus 2 by RT PCR NEGATIVE NEGATIVE   Imaging Studies: No results found.  ED COURSE and MDM  Nursing notes, initial and subsequent vitals signs, including pulse oximetry, reviewed and interpreted by myself.  Vitals:   04/25/22 2218 04/25/22 2220  BP:  (!) 158/98  Pulse:  79  Resp:  20  Temp:  98.1 F (36.7 C)  TempSrc:  Oral  SpO2:  99%  Weight: 136.1  kg   Height: 5\' 5"  (1.651 m)    Medications  amoxicillin (AMOXIL) capsule 1,000 mg (has no administration in time range)    Patient is negative for strep but we will go ahead and treat for tonsillitis.  The patient states this is the worst sore throat she has ever had.  She also has recurrent ear infections.  While her TMs appear normal the antibiotic will hopefully prevent progression to an otitis media.  PROCEDURES  Procedures   ED DIAGNOSES     ICD-10-CM   1. Tonsillitis  J03.90          Ilona Colley, , MD 04/26/22 [phone]

## 2022-04-26 NOTE — ED Notes (Signed)
ED Provider at bedside. 

## 2022-07-27 ENCOUNTER — Other Ambulatory Visit: Payer: Self-pay

## 2022-07-27 ENCOUNTER — Emergency Department (HOSPITAL_BASED_OUTPATIENT_CLINIC_OR_DEPARTMENT_OTHER)
Admission: EM | Admit: 2022-07-27 | Discharge: 2022-07-27 | Disposition: A | Discharge status: Home / Self Care | Payer: BC Managed Care – PPO | Attending: Emergency Medicine | Admitting: Emergency Medicine

## 2022-07-27 ENCOUNTER — Encounter (HOSPITAL_BASED_OUTPATIENT_CLINIC_OR_DEPARTMENT_OTHER): Payer: Self-pay | Admitting: Emergency Medicine

## 2022-07-27 DIAGNOSIS — R110 Nausea: Secondary | ICD-10-CM | POA: Diagnosis not present

## 2022-07-27 DIAGNOSIS — I10 Essential (primary) hypertension: Secondary | ICD-10-CM | POA: Diagnosis not present

## 2022-07-27 DIAGNOSIS — R61 Generalized hyperhidrosis: Secondary | ICD-10-CM | POA: Diagnosis not present

## 2022-07-27 DIAGNOSIS — R42 Dizziness and giddiness: Secondary | ICD-10-CM | POA: Diagnosis present

## 2022-07-27 DIAGNOSIS — Z79899 Other long term (current) drug therapy: Secondary | ICD-10-CM | POA: Insufficient documentation

## 2022-07-27 DIAGNOSIS — R197 Diarrhea, unspecified: Secondary | ICD-10-CM | POA: Insufficient documentation

## 2022-07-27 DIAGNOSIS — J45909 Unspecified asthma, uncomplicated: Secondary | ICD-10-CM | POA: Insufficient documentation

## 2022-07-27 HISTORY — DX: Essential (primary) hypertension: I10

## 2022-07-27 LAB — URINALYSIS, ROUTINE W REFLEX MICROSCOPIC
Bilirubin Urine: NEGATIVE
Glucose, UA: NEGATIVE mg/dL
Ketones, ur: NEGATIVE mg/dL
Leukocytes,Ua: NEGATIVE
Nitrite: NEGATIVE
Protein, ur: 100 mg/dL — AB
Specific Gravity, Urine: 1.025 (ref 1.005–1.030)
pH: 7 (ref 5.0–8.0)

## 2022-07-27 LAB — URINALYSIS, MICROSCOPIC (REFLEX)

## 2022-07-27 LAB — CBC WITH DIFFERENTIAL/PLATELET
Abs Immature Granulocytes: 0.01 10*3/uL (ref 0.00–0.07)
Basophils Absolute: 0 10*3/uL (ref 0.0–0.1)
Basophils Relative: 0 %
Eosinophils Absolute: 0.1 10*3/uL (ref 0.0–0.5)
Eosinophils Relative: 2 %
HCT: 40.7 % (ref 36.0–46.0)
Hemoglobin: 13.2 g/dL (ref 12.0–15.0)
Immature Granulocytes: 0 %
Lymphocytes Relative: 29 %
Lymphs Abs: 1.5 10*3/uL (ref 0.7–4.0)
MCH: 26.2 pg (ref 26.0–34.0)
MCHC: 32.4 g/dL (ref 30.0–36.0)
MCV: 80.8 fL (ref 80.0–100.0)
Monocytes Absolute: 0.3 10*3/uL (ref 0.1–1.0)
Monocytes Relative: 6 %
Neutro Abs: 3.3 10*3/uL (ref 1.7–7.7)
Neutrophils Relative %: 63 %
Platelets: 249 10*3/uL (ref 150–400)
RBC: 5.04 MIL/uL (ref 3.87–5.11)
RDW: 13.4 % (ref 11.5–15.5)
WBC: 5.2 10*3/uL (ref 4.0–10.5)
nRBC: 0 % (ref 0.0–0.2)

## 2022-07-27 LAB — COMPREHENSIVE METABOLIC PANEL
ALT: 13 U/L (ref 0–44)
AST: 13 U/L — ABNORMAL LOW (ref 15–41)
Albumin: 3.5 g/dL (ref 3.5–5.0)
Alkaline Phosphatase: 51 U/L (ref 38–126)
Anion gap: 5 (ref 5–15)
BUN: 10 mg/dL (ref 6–20)
CO2: 26 mmol/L (ref 22–32)
Calcium: 8.7 mg/dL — ABNORMAL LOW (ref 8.9–10.3)
Chloride: 108 mmol/L (ref 98–111)
Creatinine, Ser: 0.63 mg/dL (ref 0.44–1.00)
GFR, Estimated: >60 mL/min (ref >60)
Glucose, Bld: 128 mg/dL — ABNORMAL HIGH (ref 70–99)
Potassium: 3.9 mmol/L (ref 3.5–5.1)
Sodium: 139 mmol/L (ref 135–145)
Total Bilirubin: 0.4 mg/dL (ref 0.3–1.2)
Total Protein: 7.1 g/dL (ref 6.5–8.1)

## 2022-07-27 LAB — LIPASE, BLOOD: Lipase: 35 U/L (ref 11–51)

## 2022-07-27 LAB — PREGNANCY, URINE: Preg Test, Ur: NEGATIVE

## 2022-07-27 MED ORDER — MECLIZINE HCL 25 MG PO TABS: 25.0000 mg | ORAL_TABLET | Freq: Three times a day (TID) | ORAL | 0 refills | Status: AC | PRN

## 2022-07-27 MED ORDER — MECLIZINE HCL 25 MG PO TABS
25.0000 mg | ORAL_TABLET | Freq: Once | ORAL | Status: AC
  Administered 2022-07-27: 25 mg via ORAL
  Filled 2022-07-27: qty 1

## 2022-07-27 MED ORDER — SODIUM CHLORIDE 0.9 % IV BOLUS
1000.0000 mL | Freq: Once | INTRAVENOUS | Status: AC
  Administered 2022-07-27: 1000 mL via INTRAVENOUS

## 2022-07-27 NOTE — ED Provider Notes (Signed)
[place] EMERGENCY DEPARTMENT Provider Note   CSN: [id number] Arrival date & time: 07/27/22  5093     History  Chief Complaint  Patient presents with   Dizziness   Diarrhea    Leslie Decker is a 41 y.o. female.  Pt is a 41 yo female with a pmhx significant for asthma and htn.  Pt said she was fine until she woke up this am.  When she woke up, she felt very dizzy.  She felt like the room was spinning and she stumbled to the bathroom.  She did have some diarrhea, but no vomiting.  She felt nauseous and sweaty.  She said sx have improved, but the still feels a little dizzy.  No f/c.  No cough.  No sinus complaints.  No ear pain or ringing.         Home Medications Prior to Admission medications   Medication Sig Start Date End Date Taking? Authorizing Provider  losartan (COZAAR) 25 MG tablet Take 25 mg by mouth daily. Unknown mg   Yes [provider]  meclizine (ANTIVERT) 25 MG tablet Take 1 tablet (25 mg total) by mouth 3 (three) times daily as needed for dizziness. 07/27/22  Yes Isla Pence, MD  amoxicillin (AMOXIL) 500 MG capsule Take 2 capsules (1000 mg) daily for 9 days starting the morning of 04/27/2022. 04/26/22   Molpus, Jenny Reichmann, MD  fluconazole (DIFLUCAN) 150 MG tablet Take 1 tablet as needed for vaginal yeast infection.  May repeat in 3 days if symptoms persist. 04/26/22   Molpus, Jenny Reichmann, MD  naproxen (NAPROSYN) 500 MG tablet Take 1 tablet (500 mg total) by mouth 2 (two) times daily. 10/28/69   Prince Rome, PA-C      Allergies    Patient has no known allergies.    Review of Systems   Review of Systems  Constitutional:  Positive for diaphoresis.  Gastrointestinal:  Positive for diarrhea.  Neurological:  Positive for dizziness.  All other systems reviewed and are negative.   Physical Exam Updated Vital Signs BP (!) 116/58   Pulse 74   Temp 98.4 F (36.9 C) (Oral)   Resp 16   SpO2 100%  Physical Exam Vitals and nursing note reviewed.   Constitutional:      Appearance: Normal appearance. She is obese.  HENT:     Head: Normocephalic and atraumatic.     Right Ear: External ear normal.     Left Ear: External ear normal.     Nose: Nose normal.     Mouth/Throat:     Mouth: Mucous membranes are moist.     Pharynx: Oropharynx is clear.  Eyes:     Extraocular Movements: Extraocular movements intact.     Conjunctiva/sclera: Conjunctivae normal.     Pupils: Pupils are equal, round, and reactive to light.  Cardiovascular:     Rate and Rhythm: Normal rate and regular rhythm.     Pulses: Normal pulses.     Heart sounds: Normal heart sounds.  Pulmonary:     Effort: Pulmonary effort is normal.     Breath sounds: Normal breath sounds.  Abdominal:     General: Abdomen is flat. Bowel sounds are normal.     Palpations: Abdomen is soft.  Musculoskeletal:        General: Normal range of motion.     Cervical back: Normal range of motion and neck supple.  Skin:    General: Skin is warm.     Capillary Refill: Capillary  refill takes less than 2 seconds.  Neurological:     General: No focal deficit present.     Mental Status: She is alert and oriented to person, place, and time.  Psychiatric:        Mood and Affect: Mood normal.        Behavior: Behavior normal.     ED Results / Procedures / Treatments   Labs (all labs ordered are listed, but only abnormal results are displayed) Labs Reviewed  COMPREHENSIVE METABOLIC PANEL - Abnormal; Notable for the following components:      Result Value   Glucose, Bld 128 (*)    Calcium 8.7 (*)    AST 13 (*)    All other components within normal limits  URINALYSIS, ROUTINE W REFLEX MICROSCOPIC - Abnormal; Notable for the following components:   APPearance CLOUDY (*)    Hgb urine dipstick TRACE (*)    Protein, ur 100 (*)    All other components within normal limits  URINALYSIS, MICROSCOPIC (REFLEX) - Abnormal; Notable for the following components:   Bacteria, UA RARE (*)    All  other components within normal limits  CBC WITH DIFFERENTIAL/PLATELET  LIPASE, BLOOD  PREGNANCY, URINE    EKG EKG Interpretation  Date/Time:  Sunday July 27 2022 07:16:38 EST Ventricular Rate:  76 PR Interval:  130 QRS Duration: 86 QT Interval:  400 QTC Calculation: 450 R Axis:   61 Text Interpretation: Sinus rhythm duplicate Confirmed by Jacalyn Lefevre [phone]) on 07/27/2022 7:34:19 AM  Radiology No results found.  Procedures Procedures    Medications Ordered in ED Medications  sodium chloride 0.9 % bolus 1,000 mL (1,000 mLs Intravenous New Bag/Given 07/27/22 0753)  meclizine (ANTIVERT) tablet 25 mg (25 mg Oral Given 07/27/22 6301)    ED Course/ Medical Decision Making/ A&P                           Medical Decision Making Amount and/or Complexity of Data Reviewed Labs: ordered.   This patient presents to the ED for concern of dizziness, this involves an extensive number of treatment options, and is a complaint that carries with it a high risk of complications and morbidity.  The differential diagnosis includes cva, tia, vertigo, electrolyte abn, infection, pregnancy   Co morbidities that complicate the patient evaluation  Asthma and htn   Additional history obtained:  Additional history obtained from epic chart review External records from outside source obtained and reviewed including husband   Lab Tests:  I Ordered, and personally interpreted labs.  The pertinent results include:  cbc nl, cmp nl, lip nl, ua neg for uti, + protein, tr hgb, preg neg   Cardiac Monitoring:  The patient was maintained on a cardiac monitor.  I personally viewed and interpreted the cardiac monitored which showed an underlying rhythm of: nsr   Medicines ordered and prescription drug management:  I ordered medication including antivert and fluids  for vertigo  Reevaluation of the patient after these medicines showed that the patient improved I have reviewed the patients  home medicines and have made adjustments as needed   Test Considered:  Ct, but no neuro sx currently.   Problem List / ED Course:  Dizziness:  likely due to BPV.  She feels much better now and was able to ambulate to the bathroom without problems.  She is stable for d/c.  She is to f/u with pcp.  Return if worse.  Reevaluation:  After the interventions noted above, I reevaluated the patient and found that they have :improved   Social Determinants of Health:  Lives at home with husband   Dispostion:  After consideration of the diagnostic results and the patients response to treatment, I feel that the patent would benefit from discharge with outpatient f/u.          Final Clinical Impression(s) / ED Diagnoses Final diagnoses:  Vertigo    Rx / DC Orders ED Discharge Orders          Ordered    meclizine (ANTIVERT) 25 MG tablet  3 times daily PRN        07/27/22 0834              Jacalyn Lefevre, MD 07/27/22 [phone]

## 2022-07-27 NOTE — ED Triage Notes (Signed)
Pt reports she woke up with sweating, dizziness (room spinning sensation), and diarrhea. Initially felt nauseated, but no emesis. No symptoms yesterday. No known sick contacts. No chest pain or shortness of breath.

## 2023-04-25 ENCOUNTER — Other Ambulatory Visit: Payer: Self-pay

## 2023-04-25 ENCOUNTER — Encounter (HOSPITAL_BASED_OUTPATIENT_CLINIC_OR_DEPARTMENT_OTHER): Payer: Self-pay

## 2023-04-25 ENCOUNTER — Emergency Department (HOSPITAL_BASED_OUTPATIENT_CLINIC_OR_DEPARTMENT_OTHER)
Admission: EM | Admit: 2023-04-25 | Discharge: 2023-04-25 | Disposition: A | Discharge status: Home / Self Care | Payer: BC Managed Care – PPO | Attending: Emergency Medicine | Admitting: Emergency Medicine

## 2023-04-25 ENCOUNTER — Emergency Department (HOSPITAL_BASED_OUTPATIENT_CLINIC_OR_DEPARTMENT_OTHER): Payer: BC Managed Care – PPO

## 2023-04-25 DIAGNOSIS — R0789 Other chest pain: Secondary | ICD-10-CM | POA: Diagnosis not present

## 2023-04-25 DIAGNOSIS — D72829 Elevated white blood cell count, unspecified: Secondary | ICD-10-CM | POA: Diagnosis not present

## 2023-04-25 LAB — CBC
HCT: 38.9 % (ref 36.0–46.0)
Hemoglobin: 12.9 g/dL (ref 12.0–15.0)
MCH: 26.1 pg (ref 26.0–34.0)
MCHC: 33.2 g/dL (ref 30.0–36.0)
MCV: 78.6 fL — ABNORMAL LOW (ref 80.0–100.0)
Platelets: 279 10*3/uL (ref 150–400)
RBC: 4.95 MIL/uL (ref 3.87–5.11)
RDW: 13.2 % (ref 11.5–15.5)
WBC: 7.2 10*3/uL (ref 4.0–10.5)
nRBC: 0 % (ref 0.0–0.2)

## 2023-04-25 LAB — BASIC METABOLIC PANEL
Anion gap: 7 (ref 5–15)
BUN: 8 mg/dL (ref 6–20)
CO2: 21 mmol/L — ABNORMAL LOW (ref 22–32)
Calcium: 9.3 mg/dL (ref 8.9–10.3)
Chloride: 107 mmol/L (ref 98–111)
Creatinine, Ser: 0.75 mg/dL (ref 0.44–1.00)
GFR, Estimated: >60 mL/min (ref >60)
Glucose, Bld: 143 mg/dL — ABNORMAL HIGH (ref 70–99)
Potassium: 3.3 mmol/L — ABNORMAL LOW (ref 3.5–5.1)
Sodium: 135 mmol/L (ref 135–145)

## 2023-04-25 LAB — TROPONIN I (HIGH SENSITIVITY): Troponin I (High Sensitivity): 4 ng/L (ref <18)

## 2023-04-25 LAB — PREGNANCY, URINE: Preg Test, Ur: NEGATIVE

## 2023-04-25 MED ORDER — POTASSIUM CHLORIDE CRYS ER 20 MEQ PO TBCR
40.0000 meq | EXTENDED_RELEASE_TABLET | Freq: Once | ORAL | Status: AC
  Administered 2023-04-25: 40 meq via ORAL
  Filled 2023-04-25: qty 2

## 2023-04-25 MED ORDER — ETODOLAC 400 MG PO TABS
400.0000 mg | ORAL_TABLET | Freq: Two times a day (BID) | ORAL | 0 refills | Status: AC
Start: 2023-04-25 — End: ?

## 2023-04-25 NOTE — Discharge Instructions (Addendum)
Your workup today was reassuring.  No concerning cause of your chest pain.  Your potassium was slightly low.  I gave you a supplement in the emergency department.  Follow-up with your primary care provider to have this rechecked.  Follow-up with your primary care provider to ensure the chest pain improved.  If you have any concerning symptoms return to the emergency room.  Your pain is likely musculoskeletal

## 2023-04-25 NOTE — ED Provider Notes (Signed)
[place] [facility] Provider Note   CSN: [id number] Arrival date & time: 04/25/23  1303     History  Chief Complaint  Patient presents with   Chest Pain    Leslie Decker is a 42 y.o. female.  42 year old female presents today for evaluation of chest discomfort.  States it feels like a stretch in the center Arora stretch when she moves her arms a certain way.  This started yesterday after waking up.  Denies any history of CAD personally or for family with the exception of her grandma.  No history of DVT or PE.  No significant clotting disorders for her family either.  She states she recently started a new job and is not any more physically demanding than her previous job.  Has taken Tylenol without significant improvement.  Denies dyspnea.  No leg pain or leg swelling.  Not on hormone replacement therapy.  The history is provided by the patient. No language interpreter was used.       Home Medications Prior to Admission medications   Medication Sig Start Date End Date Taking? Authorizing Provider  etodolac (LODINE) 400 MG tablet Take 1 tablet (400 mg total) by mouth 2 (two) times daily. 04/25/23  Yes Karie Mainland, Jeymi Hepp, PA-C  amoxicillin (AMOXIL) 500 MG capsule Take 2 capsules (1000 mg) daily for 9 days starting the morning of 04/27/2022. 04/26/22   Molpus, John, MD  fluconazole (DIFLUCAN) 150 MG tablet Take 1 tablet as needed for vaginal yeast infection.  May repeat in 3 days if symptoms persist. 04/26/22   Molpus, Jonny Ruiz, MD  losartan (COZAAR) 25 MG tablet Take 25 mg by mouth daily. Unknown mg    [provider]  meclizine (ANTIVERT) 25 MG tablet Take 1 tablet (25 mg total) by mouth 3 (three) times daily as needed for dizziness. 07/27/22   Jacalyn Lefevre, MD      Allergies    Patient has no known allergies.    Review of Systems   Review of Systems  Constitutional:  Negative for chills and fever.  Respiratory:  Negative for shortness of breath.    Cardiovascular:  Positive for chest pain. Negative for palpitations and leg swelling.  Neurological:  Negative for light-headedness.  All other systems reviewed and are negative.   Physical Exam Updated Vital Signs BP (!) 142/88 (BP Location: Left Arm)   Pulse 81   Temp 98.7 F (37.1 C) (Oral)   Resp 14   Ht 5\' 5"  (1.651 m)   Wt 136.1 kg   LMP 04/05/2023 (Exact Date)   SpO2 99%   BMI 49.92 kg/m  Physical Exam Vitals and nursing note reviewed.  Constitutional:      General: She is not in acute distress.    Appearance: Normal appearance. She is not ill-appearing.  HENT:     Head: Normocephalic and atraumatic.     Nose: Nose normal.  Eyes:     General: No scleral icterus.    Extraocular Movements: Extraocular movements intact.     Conjunctiva/sclera: Conjunctivae normal.  Cardiovascular:     Rate and Rhythm: Normal rate and regular rhythm.     Pulses: Normal pulses.     Heart sounds: Normal heart sounds.  Pulmonary:     Effort: Pulmonary effort is normal. No respiratory distress.     Breath sounds: Normal breath sounds. No wheezing or rales.  Abdominal:     General: There is no distension.     Tenderness: There is no  abdominal tenderness.  Musculoskeletal:        General: Normal range of motion.     Cervical back: Normal range of motion.  Skin:    General: Skin is warm and dry.  Neurological:     General: No focal deficit present.     Mental Status: She is alert. Mental status is at baseline.     ED Results / Procedures / Treatments   Labs (all labs ordered are listed, but only abnormal results are displayed) Labs Reviewed  BASIC METABOLIC PANEL - Abnormal; Notable for the following components:      Result Value   Potassium 3.3 (*)    CO2 21 (*)    Glucose, Bld 143 (*)    All other components within normal limits  CBC - Abnormal; Notable for the following components:   MCV 78.6 (*)    All other components within normal limits  PREGNANCY, URINE  TROPONIN  I (HIGH SENSITIVITY)  TROPONIN I (HIGH SENSITIVITY)    EKG EKG Interpretation Date/Time:  Saturday April 25 2023 13:11:34 EDT Ventricular Rate:  79 PR Interval:  154 QRS Duration:  83 QT Interval:  363 QTC Calculation: 417 R Axis:   58  Text Interpretation: Sinus rhythm Probable left atrial enlargement Confirmed by Virgina Norfolk [phone]) on 04/25/2023 2:47:17 PM  Radiology DG Chest 2 View  Result Date: 04/25/2023 CLINICAL DATA:  Chest pain EXAM: CHEST - 2 VIEW COMPARISON:  01/22/2021 FINDINGS: The heart size and mediastinal contours are within normal limits. Both lungs are clear. The visualized skeletal structures are unremarkable. IMPRESSION: No active cardiopulmonary disease. Electronically Signed   By: Ernie Avena M.D.   On: 04/25/2023 13:48    Procedures Procedures    Medications Ordered in ED Medications  potassium chloride SA (KLOR-CON M) CR tablet 40 mEq (has no administration in time range)    ED Course/ Medical Decision Making/ A&P                                 Medical Decision Making Amount and/or Complexity of Data Reviewed Labs: ordered. Radiology: ordered.  Risk Prescription drug management.   42 year old female presents today for evaluation of chest discomfort.  Ongoing since yesterday morning.  Does not radiate anywhere.  Worse with certain range of motion.  C/L leukocytosis or anemia.  BMP shows potassium of 3.3, glucose 143 otherwise without acute finding.  Troponin of 4.  Test negative.  Chest x-ray without acute cardiopulmonary process.  EKG without acute ischemic changes.  Low suspicion for ACS given the reassuring workup above.  Duration of symptoms do not feel she needs repeat troponin.  Low suspicion for PE.  She is without tachypnea, tachycardia, or hypoxia.  Low risk on Wells criteria for PE.  Likely MSK in etiology.  Lodine prescribed.  Discussed close follow-up with PCP.  Return precautions discussed.  Patient voices understanding  and is in agreement with plan.   Final Clinical Impression(s) / ED Diagnoses Final diagnoses:  Atypical chest pain    Rx / DC Orders ED Discharge Orders          Ordered    etodolac (LODINE) 400 MG tablet  2 times daily        04/25/23 1542              Marita Kansas, PA-C 04/25/23 1556    [address], DO 04/25/23 1614

## 2023-04-25 NOTE — ED Triage Notes (Signed)
Patient has been having chest tightness since yesterday. It also hurts when she moves her right arm. She is also very fatigued. She denied cough, shortness of breath or fever.

## 2023-06-23 ENCOUNTER — Emergency Department (HOSPITAL_BASED_OUTPATIENT_CLINIC_OR_DEPARTMENT_OTHER): Payer: BC Managed Care – PPO

## 2023-06-23 ENCOUNTER — Encounter (HOSPITAL_BASED_OUTPATIENT_CLINIC_OR_DEPARTMENT_OTHER): Payer: Self-pay

## 2023-06-23 ENCOUNTER — Other Ambulatory Visit: Payer: Self-pay

## 2023-06-23 DIAGNOSIS — Z79899 Other long term (current) drug therapy: Secondary | ICD-10-CM | POA: Insufficient documentation

## 2023-06-23 DIAGNOSIS — I10 Essential (primary) hypertension: Secondary | ICD-10-CM | POA: Diagnosis not present

## 2023-06-23 DIAGNOSIS — E669 Obesity, unspecified: Secondary | ICD-10-CM | POA: Insufficient documentation

## 2023-06-23 DIAGNOSIS — J45909 Unspecified asthma, uncomplicated: Secondary | ICD-10-CM | POA: Diagnosis not present

## 2023-06-23 DIAGNOSIS — R109 Unspecified abdominal pain: Secondary | ICD-10-CM | POA: Insufficient documentation

## 2023-06-23 DIAGNOSIS — Z6841 Body Mass Index (BMI) 40.0 and over, adult: Secondary | ICD-10-CM | POA: Diagnosis not present

## 2023-06-23 LAB — COMPREHENSIVE METABOLIC PANEL
ALT: 14 U/L (ref 0–44)
AST: 12 U/L — ABNORMAL LOW (ref 15–41)
Albumin: 3.9 g/dL (ref 3.5–5.0)
Alkaline Phosphatase: 46 U/L (ref 38–126)
Anion gap: 9 (ref 5–15)
BUN: 14 mg/dL (ref 6–20)
CO2: 23 mmol/L (ref 22–32)
Calcium: 8.9 mg/dL (ref 8.9–10.3)
Chloride: 103 mmol/L (ref 98–111)
Creatinine, Ser: 0.69 mg/dL (ref 0.44–1.00)
GFR, Estimated: >60 mL/min (ref >60)
Glucose, Bld: 102 mg/dL — ABNORMAL HIGH (ref 70–99)
Potassium: 3.6 mmol/L (ref 3.5–5.1)
Sodium: 135 mmol/L (ref 135–145)
Total Bilirubin: 0.6 mg/dL (ref 0.3–1.2)
Total Protein: 7.3 g/dL (ref 6.5–8.1)

## 2023-06-23 LAB — URINALYSIS, MICROSCOPIC (REFLEX)

## 2023-06-23 LAB — URINALYSIS, ROUTINE W REFLEX MICROSCOPIC
Bilirubin Urine: NEGATIVE
Glucose, UA: NEGATIVE mg/dL
Ketones, ur: NEGATIVE mg/dL
Leukocytes,Ua: NEGATIVE
Nitrite: NEGATIVE
Protein, ur: NEGATIVE mg/dL
Specific Gravity, Urine: >=1.03 (ref 1.005–1.030)
pH: 6 (ref 5.0–8.0)

## 2023-06-23 LAB — CBC WITH DIFFERENTIAL/PLATELET
Abs Immature Granulocytes: 0.01 10*3/uL (ref 0.00–0.07)
Basophils Absolute: 0 10*3/uL (ref 0.0–0.1)
Basophils Relative: 0 %
Eosinophils Absolute: 0.1 10*3/uL (ref 0.0–0.5)
Eosinophils Relative: 2 %
HCT: 40.3 % (ref 36.0–46.0)
Hemoglobin: 13 g/dL (ref 12.0–15.0)
Immature Granulocytes: 0 %
Lymphocytes Relative: 42 %
Lymphs Abs: 2.9 10*3/uL (ref 0.7–4.0)
MCH: 25.7 pg — ABNORMAL LOW (ref 26.0–34.0)
MCHC: 32.3 g/dL (ref 30.0–36.0)
MCV: 79.8 fL — ABNORMAL LOW (ref 80.0–100.0)
Monocytes Absolute: 0.3 10*3/uL (ref 0.1–1.0)
Monocytes Relative: 5 %
Neutro Abs: 3.6 10*3/uL (ref 1.7–7.7)
Neutrophils Relative %: 51 %
Platelets: 294 10*3/uL (ref 150–400)
RBC: 5.05 MIL/uL (ref 3.87–5.11)
RDW: 13.6 % (ref 11.5–15.5)
WBC: 6.9 10*3/uL (ref 4.0–10.5)
nRBC: 0 % (ref 0.0–0.2)

## 2023-06-23 LAB — LIPASE, BLOOD: Lipase: 20 U/L (ref 11–51)

## 2023-06-23 LAB — PREGNANCY, URINE: Preg Test, Ur: NEGATIVE

## 2023-06-23 NOTE — ED Triage Notes (Signed)
Pt here with abdominal/flank pain, was seen at [facility] and had blood in her urine and sent her here for CT scan + nausea

## 2023-06-24 ENCOUNTER — Emergency Department (HOSPITAL_BASED_OUTPATIENT_CLINIC_OR_DEPARTMENT_OTHER)
Admission: EM | Admit: 2023-06-24 | Discharge: 2023-06-24 | Disposition: A | Discharge status: Home / Self Care | Payer: BC Managed Care – PPO | Attending: Emergency Medicine | Admitting: Emergency Medicine

## 2023-06-24 DIAGNOSIS — R109 Unspecified abdominal pain: Secondary | ICD-10-CM

## 2023-06-24 MED ORDER — PANTOPRAZOLE SODIUM 20 MG PO TBEC: 20.0000 mg | DELAYED_RELEASE_TABLET | Freq: Every day | ORAL | 0 refills | Status: AC

## 2023-06-24 MED ORDER — ACETAMINOPHEN 325 MG PO TABS: 650.0000 mg | ORAL_TABLET | Freq: Four times a day (QID) | ORAL | 0 refills | Status: AC | PRN

## 2023-06-24 MED ORDER — OXYCODONE HCL 5 MG PO TABS: 5.0000 mg | ORAL_TABLET | Freq: Four times a day (QID) | ORAL | 0 refills | Status: AC | PRN

## 2023-06-24 MED ORDER — ACETAMINOPHEN 500 MG PO TABS
1000.0000 mg | ORAL_TABLET | Freq: Once | ORAL | Status: AC
  Administered 2023-06-24: 1000 mg via ORAL
  Filled 2023-06-24: qty 2

## 2023-06-24 MED ORDER — KETOROLAC TROMETHAMINE 60 MG/2ML IM SOLN
15.0000 mg | Freq: Once | INTRAMUSCULAR | Status: AC
  Administered 2023-06-24: 15 mg via INTRAMUSCULAR
  Filled 2023-06-24: qty 2

## 2023-06-24 MED ORDER — ONDANSETRON HCL 4 MG PO TABS: 4.0000 mg | ORAL_TABLET | ORAL | 0 refills | Status: AC | PRN

## 2023-06-24 NOTE — ED Provider Notes (Addendum)
[place]  [facility] Provider Note  CSN: [id number] Arrival date & time: 06/23/23 1934  Chief Complaint(s) Abdominal Pain  HPI Leslie Decker is a 42 y.o. female with past medical history as below, significant for asthma, hypertension, obesity, MDD who presents to the ED with complaint of abdominal pain  Abdominal pain over the past 4 days, worsened with ambulation, moving her leg.  Twisting torso.  Right lower quadrant, sharp and stabbing.  No vomiting, +nausea.  Pain is constant since onset but worsened with walking/torso twisting. No change in bowel or bladder function.  No medications prior to arrival.  Denies similar symptoms in the past.  Was seen in urgent care due to concern for blood in the urine, sent for CT to rule out kidney stone.  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 Authorizing Provider  acetaminophen (TYLENOL) 325 MG tablet Take 2 tablets (650 mg total) by mouth every 6 (six) hours as needed. 06/24/23  Yes Tanda Rockers A, DO  ondansetron (ZOFRAN) 4 MG tablet Take 1 tablet (4 mg total) by mouth every 4 (four) hours as needed for nausea or vomiting. 06/24/23  Yes Tanda Rockers A, DO  oxyCODONE (ROXICODONE) 5 MG immediate release tablet Take 1 tablet (5 mg total) by mouth every 6 (six) hours as needed. 06/24/23  Yes Tanda Rockers A, DO  pantoprazole (PROTONIX) 20 MG tablet Take 1 tablet (20 mg total) by mouth daily for 14 days. 06/24/23 07/08/23 Yes Sloan Leiter, DO  amoxicillin (AMOXIL) 500 MG capsule Take 2 capsules (1000 mg) daily for 9 days  starting the morning of 04/27/2022. 04/26/22   Molpus, Jonny Ruiz, MD  etodolac (LODINE) 400 MG tablet Take 1 tablet (400 mg total) by mouth 2 (two) times daily. 04/25/23   Marita Kansas, PA-C  fluconazole (DIFLUCAN) 150 MG tablet Take 1 tablet as needed for vaginal yeast infection.  May repeat in 3 days if symptoms persist. 04/26/22   Molpus, Jonny Ruiz, MD  losartan (COZAAR) 25 MG tablet Take 25 mg by mouth daily. Unknown mg    [provider]  meclizine (ANTIVERT) 25 MG tablet Take 1 tablet (25 mg total) by mouth 3 (three) times daily as needed for dizziness. 07/27/22   Jacalyn Lefevre, MD                                                                                                                                    Past Surgical History Past Surgical History:  Procedure Laterality Date   NO PAST SURGERIES  TUBAL LIGATION  06/25/2011   Procedure: POST PARTUM TUBAL LIGATION;  Surgeon: Esmeralda Arthur, MD;  Location: [facility];  Service: Gynecology;  Laterality: Bilateral;   Family History Family History  Problem Relation Age of Onset   Hypertension Maternal Grandmother    Diabetes Maternal Grandmother     Social History Social History   Tobacco Use   Smoking status: Never   Smokeless tobacco: Never  Vaping Use   Vaping status: Never Used  Substance Use Topics   Alcohol use: No   Drug use: No   Allergies Patient has no known allergies.  Review of Systems Review of Systems  Constitutional:  Negative for chills and fever.  Respiratory:  Negative for shortness of breath.   Cardiovascular:  Negative for chest pain.  Gastrointestinal:  Positive for abdominal pain and nausea. Negative for blood in stool, diarrhea and vomiting.  Genitourinary:  Negative for dysuria and urgency.  Musculoskeletal:  Negative for arthralgias and back pain.  Skin:  Negative for rash and wound.  All other systems reviewed and are negative.   Physical Exam Vital Signs  I have reviewed the triage vital signs BP  (!) 185/106 (BP Location: Left Arm) Comment: Pt states that she forgot to take her bp medication  Pulse 69   Temp 97.8 F (36.6 C) (Oral)   Resp 18   Ht 5\' 5"  (1.651 m)   Wt 133.4 kg   LMP 04/27/2023 (Exact Date)   SpO2 98%   BMI 48.92 kg/m  Physical Exam Vitals and nursing note reviewed.  Constitutional:      General: She is not in acute distress.    Appearance: Normal appearance. She is well-developed. She is obese.  HENT:     Head: Normocephalic and atraumatic.     Right Ear: External ear normal.     Left Ear: External ear normal.     Nose: Nose normal.     Mouth/Throat:     Mouth: Mucous membranes are moist.  Eyes:     General: No scleral icterus.       Right eye: No discharge.        Left eye: No discharge.  Cardiovascular:     Rate and Rhythm: Normal rate and regular rhythm.     Pulses: Normal pulses.     Heart sounds: Normal heart sounds.  Pulmonary:     Effort: Pulmonary effort is normal. No respiratory distress.     Breath sounds: Normal breath sounds. No stridor.  Abdominal:     General: Abdomen is flat. There is no distension.     Palpations: Abdomen is soft.     Tenderness: There is no abdominal tenderness. There is no guarding or rebound. Negative signs include Murphy's sign and Rovsing's sign.     Comments: Nonperitoneal  Musculoskeletal:     Cervical back: No rigidity.     Right lower leg: No edema.     Left lower leg: No edema.  Skin:    General: Skin is warm and dry.     Capillary Refill: Capillary refill takes less than 2 seconds.  Neurological:     Mental Status: She is alert.  Psychiatric:        Mood and Affect: Mood normal.        Behavior: Behavior normal. Behavior is cooperative.     ED Results and Treatments Labs (all labs ordered are listed, but only abnormal results are displayed) Labs Reviewed  CBC WITH DIFFERENTIAL/PLATELET - Abnormal; Notable for  the following components:      Result Value   MCV 79.8 (*)    MCH 25.7 (*)     All other components within normal limits  COMPREHENSIVE METABOLIC PANEL - Abnormal; Notable for the following components:   Glucose, Bld 102 (*)    AST 12 (*)    All other components within normal limits  URINALYSIS, ROUTINE W REFLEX MICROSCOPIC - Abnormal; Notable for the following components:   Hgb urine dipstick SMALL (*)    All other components within normal limits  URINALYSIS, MICROSCOPIC (REFLEX) - Abnormal; Notable for the following components:   Bacteria, UA RARE (*)    All other components within normal limits  LIPASE, BLOOD  PREGNANCY, URINE                                                                                                                          Radiology CT Renal Stone Study  Result Date: 06/23/2023 CLINICAL DATA:  Abdomen flank pain EXAM: CT ABDOMEN AND PELVIS WITHOUT CONTRAST TECHNIQUE: Multidetector CT imaging of the abdomen and pelvis was performed following the standard protocol without IV contrast. RADIATION DOSE REDUCTION: This exam was performed according to the departmental dose-optimization program which includes automated exposure control, adjustment of the mA and/or kV according to patient size and/or use of iterative reconstruction technique. COMPARISON:  None Available. FINDINGS: Lower chest: No acute abnormality. Hepatobiliary: No focal liver abnormality is seen. No gallstones, gallbladder wall thickening, or biliary dilatation. Pancreas: Unremarkable. No pancreatic ductal dilatation or surrounding inflammatory changes. Spleen: Normal in size without focal abnormality. Adrenals/Urinary Tract: Adrenal glands are normal. Kidneys show no hydronephrosis. Vague hypodensity midpole right kidney possible cyst, further assessment limited without contrast, no specific imaging follow-up is recommended. Bladder is nearly empty Stomach/Bowel: Stomach is within normal limits. Appendix appears normal. No evidence of bowel wall thickening, distention, or inflammatory  changes. Vascular/Lymphatic: No significant vascular findings are present. No enlarged abdominal or pelvic lymph nodes. Reproductive: Uterus and bilateral adnexa are unremarkable. Other: Negative for pelvic effusion or free air. Musculoskeletal: Degenerative changes at L5-S1. No acute osseous abnormality IMPRESSION: 1. Negative for hydronephrosis or ureteral stone. 2. Negative for acute appendicitis or bowel obstruction. 3. Degenerative changes at L5-S1. Electronically Signed   By: Jasmine Pang M.D.   On: 06/23/2023 22:30    Pertinent labs & imaging results that were available during my care of the patient were reviewed by me and considered in my medical decision making (see MDM for details).  Medications Ordered in ED Medications  ketorolac (TORADOL) injection 15 mg (15 mg Intramuscular Given 06/24/23 0326)  acetaminophen (TYLENOL) tablet 1,000 mg (1,000 mg Oral Given 06/24/23 0326)  Procedures Procedures  (including critical care time)  Medical Decision Making / ED Course    Medical Decision Making:    Makyra Corprew is a 42 y.o. female with past medical history as below, significant for asthma, hypertension, obesity, MDD who presents to the ED with complaint of abdominal pain. The complaint involves an extensive differential diagnosis and also carries with it a high risk of complications and morbidity.  Serious etiology was considered. Ddx includes but is not limited to: Differential diagnosis includes but is not exclusive to ectopic pregnancy, ovarian cyst, ovarian torsion, acute appendicitis, urinary tract infection, endometriosis, bowel obstruction, hernia, colitis, renal colic, gastroenteritis, volvulus etc.   Complete initial physical exam performed, notably the patient  was no acute distress, pain improved since onset.    Reviewed and confirmed nursing  documentation for past medical history, family history, social history.  Vital signs reviewed.        She is here with right lower quad abdominal pain.  No pain on palpation on exam.  Pain improved while in the lobby.  Labs are stable.  Imaging stable.  She was given Tylenol and Toradol and pain has improved further.  She is feeling better, tolerating p.o. without difficulty  Unclear etiology of her pain.  Workup is reassuring.  Query MSK.  Appendicitis seems less likely, ovarian torsion seems less likely.  Advised her to see your PCP tomorrow at her scheduled appointment, return for any worsening symptoms.  The patient's overall condition has improved, the patient presents with abdominal pain without signs of peritonitis, or other life-threatening serious etiology. The patient understands that at this time there is no evidence for a more malignant underlying process, but the patient also understands that early in the process of an illness, an emergency department workup can be falsely reassuring. Detailed discussions were had with the patient regarding current findings, and need for close f/u with PCP or on call doctor. The patient appears stable for discharge and has been instructed to return immediately if the symptoms worsen in any way, or if not improved for re-evaluation. Patient verbalized understanding and is in agreement with current care plan.  Reports that she has PCP appointment for later on today.  All questions answered prior to discharge.                  Additional history obtained: -Additional history obtained from na -External records from outside source obtained and reviewed including: Chart review including previous notes, labs, imaging, consultation notes including  PDMP, primary care documentation, prior ED visits   Lab Tests: -I ordered, reviewed, and interpreted labs.   The pertinent results include:   Labs Reviewed  CBC WITH DIFFERENTIAL/PLATELET -  Abnormal; Notable for the following components:      Result Value   MCV 79.8 (*)    MCH 25.7 (*)    All other components within normal limits  COMPREHENSIVE METABOLIC PANEL - Abnormal; Notable for the following components:   Glucose, Bld 102 (*)    AST 12 (*)    All other components within normal limits  URINALYSIS, ROUTINE W REFLEX MICROSCOPIC - Abnormal; Notable for the following components:   Hgb urine dipstick SMALL (*)    All other components within normal limits  URINALYSIS, MICROSCOPIC (REFLEX) - Abnormal; Notable for the following components:   Bacteria, UA RARE (*)    All other components within normal limits  LIPASE, BLOOD  PREGNANCY, URINE    Notable for labs stable  EKG  EKG Interpretation Date/Time:    Ventricular Rate:    PR Interval:    QRS Duration:    QT Interval:    QTC Calculation:   R Axis:      Text Interpretation:           Imaging Studies ordered:  CT renal ordered in triage I independently visualized the following imaging with scope of interpretation limited to determining acute life threatening conditions related to emergency care; findings noted above, significant for imaging stable I independently visualized and interpreted imaging. I agree with the radiologist interpretation   Medicines ordered and prescription drug management: Meds ordered this encounter  Medications   ketorolac (TORADOL) injection 15 mg   acetaminophen (TYLENOL) tablet 1,000 mg   oxyCODONE (ROXICODONE) 5 MG immediate release tablet    Sig: Take 1 tablet (5 mg total) by mouth every 6 (six) hours as needed.    Dispense:  5 tablet    Refill:  0   pantoprazole (PROTONIX) 20 MG tablet    Sig: Take 1 tablet (20 mg total) by mouth daily for 14 days.    Dispense:  14 tablet    Refill:  0   acetaminophen (TYLENOL) 325 MG tablet    Sig: Take 2 tablets (650 mg total) by mouth every 6 (six) hours as needed.    Dispense:  36 tablet    Refill:  0   ondansetron (ZOFRAN) 4  MG tablet    Sig: Take 1 tablet (4 mg total) by mouth every 4 (four) hours as needed for nausea or vomiting.    Dispense:  6 tablet    Refill:  0    -I have reviewed the patients home medicines and have made adjustments as needed   Consultations Obtained: na   Cardiac Monitoring: Continuous pulse oximetry interpreted by myself, 99% on RA.    Social Determinants of Health:  Diagnosis or treatment significantly limited by social determinants of health: obesity   Reevaluation: After the interventions noted above, I reevaluated the patient and found that they have improved  Co morbidities that complicate the patient evaluation  Past Medical History:  Diagnosis Date   Asthma    Hypertension       Dispostion: Disposition decision including need for hospitalization was considered, and patient discharged from emergency department.    Final Clinical Impression(s) / ED Diagnoses Final diagnoses:  Abdominal pain, unspecified abdominal location        Sloan Leiter, DO 06/24/23 0335    Sloan Leiter, DO 06/24/23 [phone]

## 2023-06-24 NOTE — Discharge Instructions (Addendum)
It was a pleasure caring for you today in the emergency department.     There are many causes of abdominal pain. Most pain is not serious and goes away, but some pain gets worse, changes, or will not go away. Please return to the emergency department or see your doctor right away if you (or your family member) experience any of the following:  1. Pain that gets worse or moves to just one spot.  2. Pain that gets worse if you cough or sneeze.  3. Pain with going over a bump in the road.  4. Pain that does not get better in 24 hours.  5. Inability to keep down liquids (vomiting)-especially if you are making less urine.  6. Fainting.  7. Blood in the vomit or stool.  8. High fever or shaking chills.  9. Swelling of the abdomen.  10. Any new or worsening problem.     Additional Instructions  No alcohol.  No caffeine, aspirin, or cigarettes.   Please return to the emergency department immediately for any new or concerning symptoms, or if you get worse.

## 2023-11-15 IMAGING — DX DG KNEE COMPLETE 4+V*L*
4 series · 4 of 4 positions shown · non-contrast
Comparison: None Available.

CLINICAL DATA: Status post fall.

EXAM:
LEFT KNEE - COMPLETE 4+ VIEW

[knee ap]
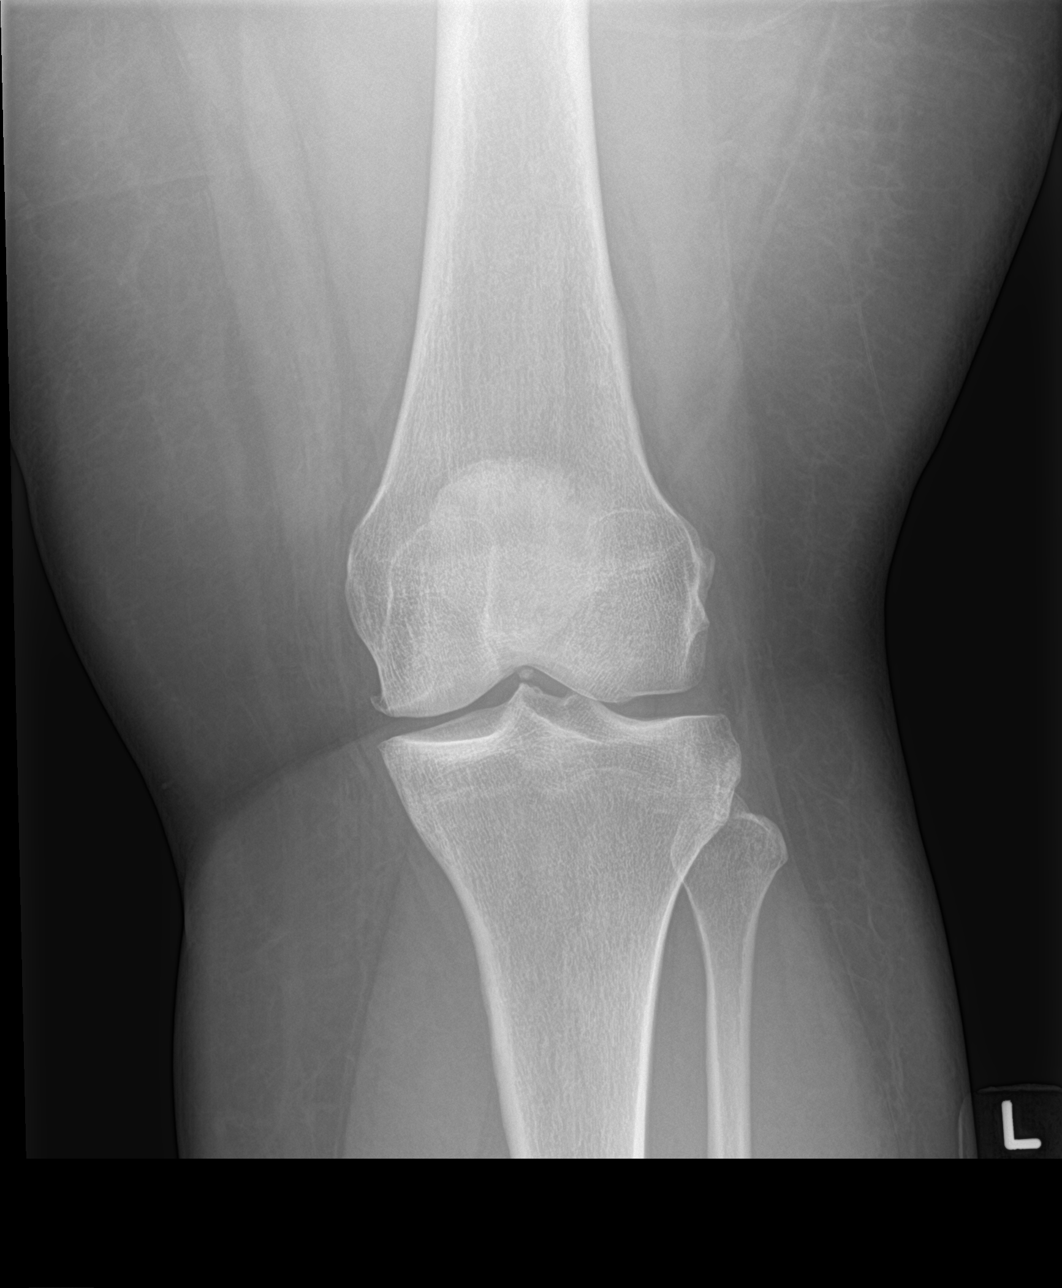

[knee lat]
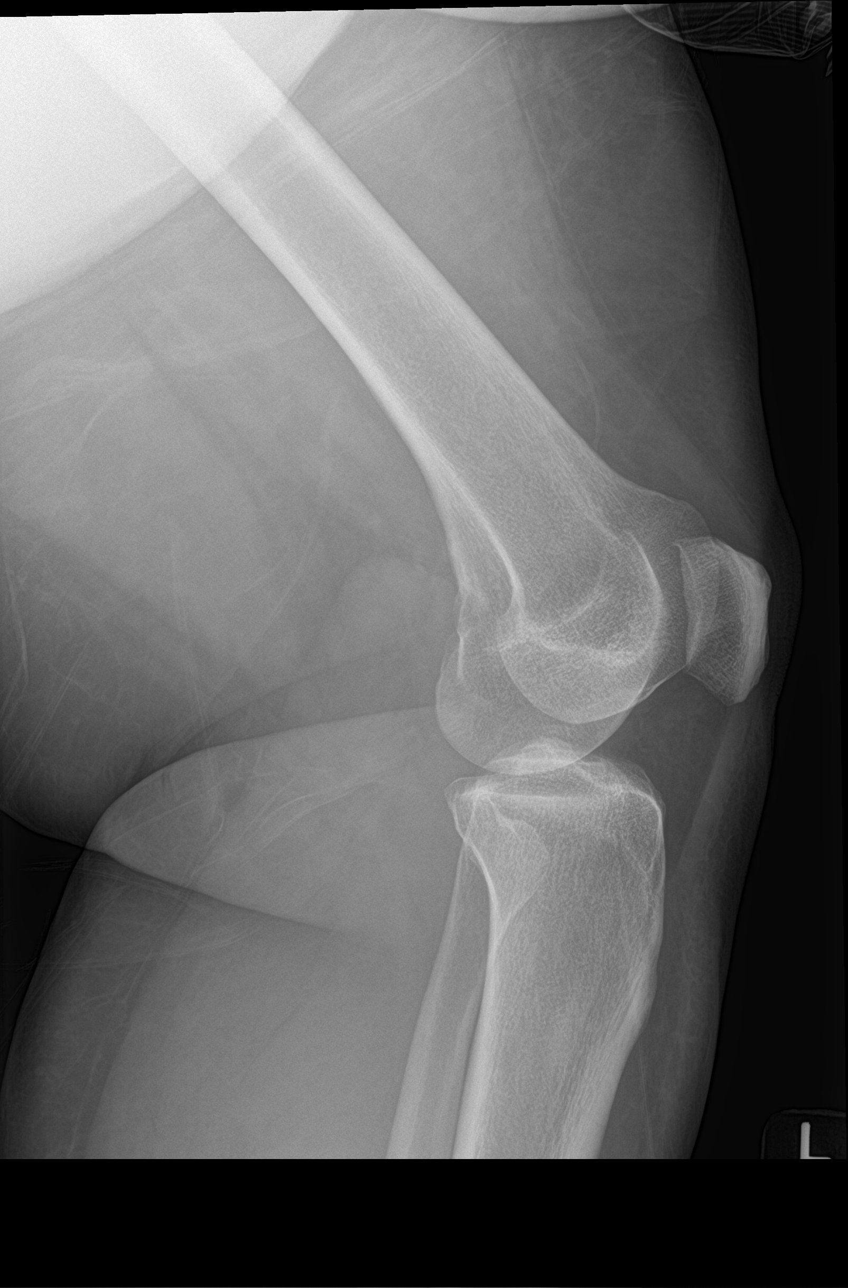

[knee obl (1 of 2)]
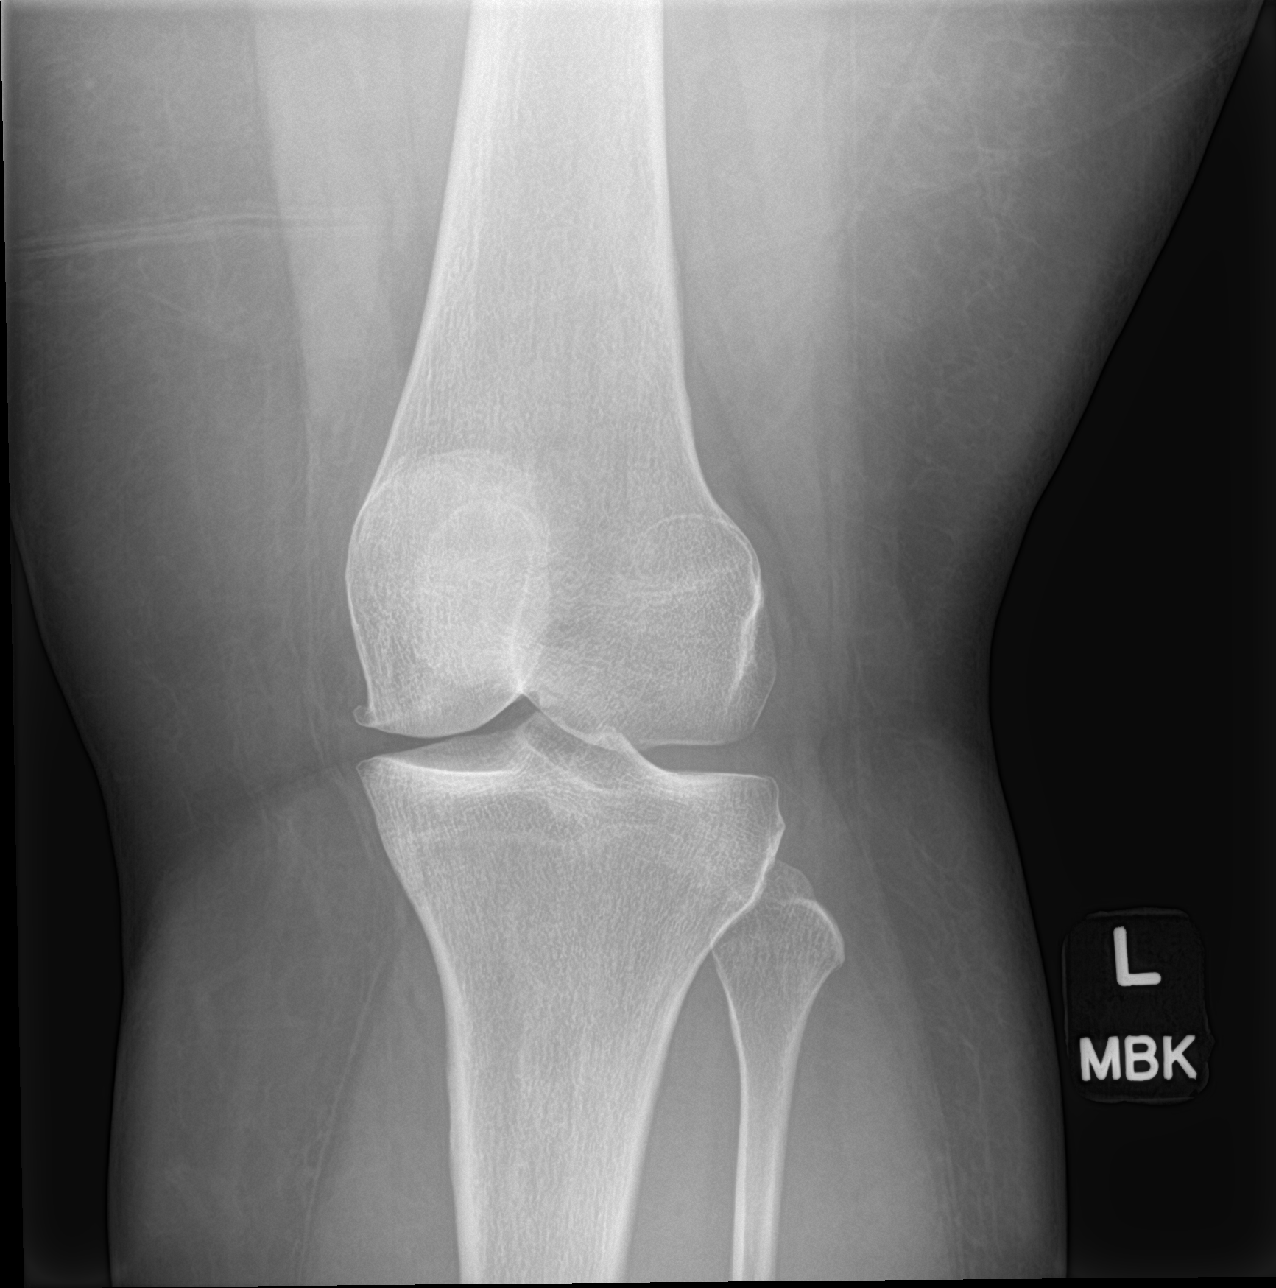

[knee obl (2 of 2)]
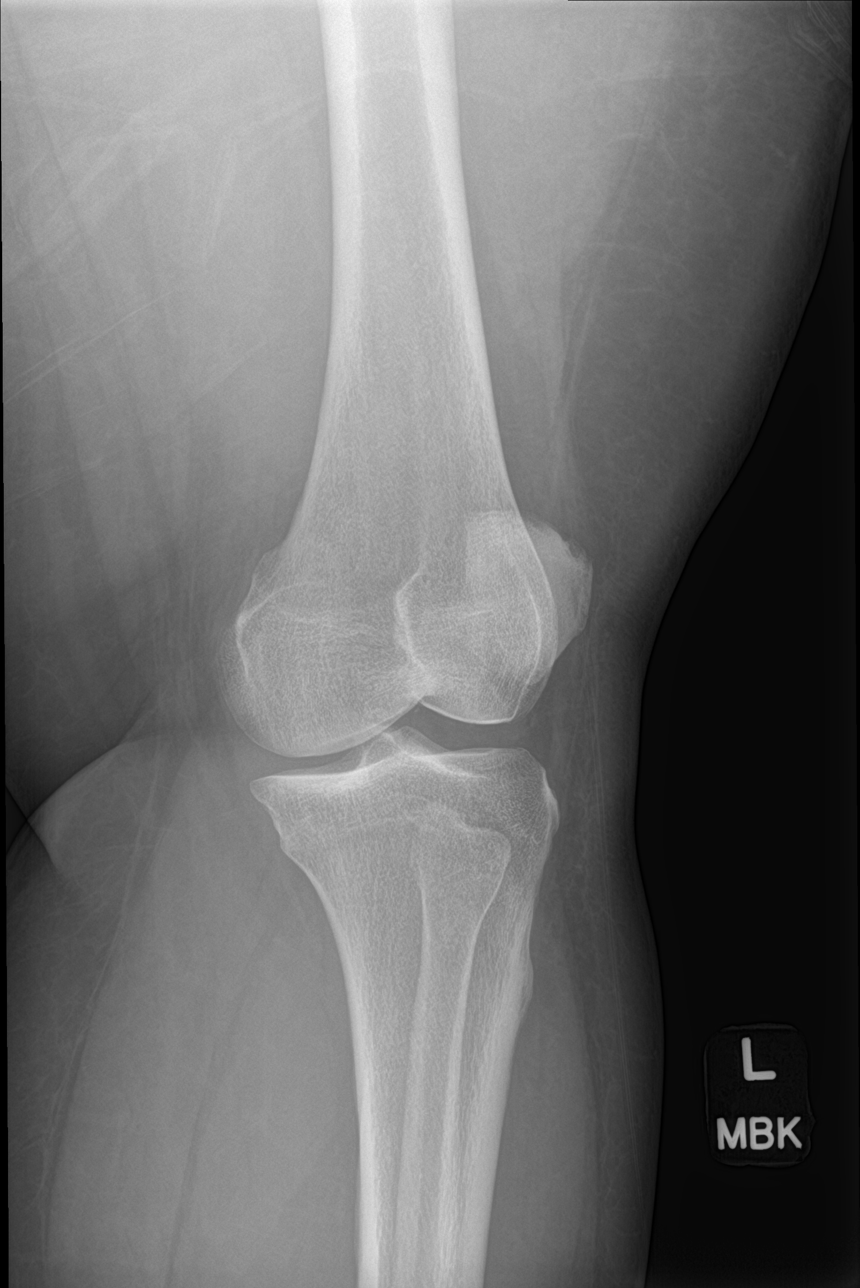

[4 of 4 positions shown; findings below may reference images not displayed]

FINDINGS: No evidence of fracture, dislocation, or joint effusion. Early
medial marginal osteophyte formation is noted. Soft tissues are
unremarkable.
IMPRESSION: No acute osseous abnormality.
